# Patient Record
Sex: Male | Born: 1960 | Race: Black or African American | Hispanic: No | Marital: Married | Smoking: Former smoker
Health system: Southern US, Community
[De-identification: ages and names within clinical notes are randomized; demographics above are authoritative.]

## PROBLEM LIST (undated history)

## (undated) DIAGNOSIS — I10 Essential (primary) hypertension: Secondary | ICD-10-CM

## (undated) HISTORY — PX: COLONOSCOPY: SHX174

---

## 2000-08-06 ENCOUNTER — Emergency Department (HOSPITAL_COMMUNITY): Admission: EM | Admit: 2000-08-06 | Discharge: 2000-08-06 | Payer: Self-pay | Admitting: Emergency Medicine

## 2000-10-08 ENCOUNTER — Emergency Department (HOSPITAL_COMMUNITY): Admission: EM | Admit: 2000-10-08 | Discharge: 2000-10-08 | Payer: Self-pay | Admitting: Internal Medicine

## 2000-10-08 ENCOUNTER — Encounter: Payer: Self-pay | Admitting: Internal Medicine

## 2002-11-16 ENCOUNTER — Emergency Department (HOSPITAL_COMMUNITY): Admission: EM | Admit: 2002-11-16 | Discharge: 2002-11-16 | Payer: Self-pay | Admitting: Emergency Medicine

## 2003-02-16 ENCOUNTER — Emergency Department (HOSPITAL_COMMUNITY): Admission: EM | Admit: 2003-02-16 | Discharge: 2003-02-16 | Payer: Self-pay

## 2004-09-05 ENCOUNTER — Emergency Department (HOSPITAL_COMMUNITY): Admission: EM | Admit: 2004-09-05 | Discharge: 2004-09-05 | Payer: Self-pay | Admitting: Emergency Medicine

## 2004-09-15 ENCOUNTER — Emergency Department (HOSPITAL_COMMUNITY): Admission: EM | Admit: 2004-09-15 | Discharge: 2004-09-15 | Payer: Self-pay | Admitting: Family Medicine

## 2008-03-27 ENCOUNTER — Encounter: Admission: RE | Admit: 2008-03-27 | Discharge: 2008-03-27 | Payer: Self-pay | Admitting: Occupational Medicine

## 2008-05-25 ENCOUNTER — Emergency Department (HOSPITAL_COMMUNITY): Admission: EM | Admit: 2008-05-25 | Discharge: 2008-05-25 | Payer: Self-pay | Admitting: Emergency Medicine

## 2009-06-10 ENCOUNTER — Encounter (INDEPENDENT_AMBULATORY_CARE_PROVIDER_SITE_OTHER): Payer: Self-pay | Admitting: *Deleted

## 2009-12-19 ENCOUNTER — Ambulatory Visit (HOSPITAL_COMMUNITY): Admission: RE | Admit: 2009-12-19 | Discharge: 2009-12-19 | Payer: Self-pay | Admitting: Gastroenterology

## 2010-05-13 NOTE — Letter (Signed)
Summary: Previsit letter  [facility]  [address] [place], [place] [address]   Phone: [phone]  Fax: [phone]       06/10/2009 MRN: [id number]  [name] [address] [place], [place]  [address]  Dear [name],  Welcome to the Gastroenterology Division at [facility].    Timothy are scheduled to see a nurse for your pre-procedure visit on 06-25-09 at 2:30p.m. on the 3rd floor at [facility], [address]  We ask that Timothy try to arrive at our office 15 minutes prior to your appointment time to allow for check-in.  Your nurse visit will consist of discussing your medical and surgical history, your immediate family medical history, and your medications.    Please bring a complete list of all your medications or, if Timothy prefer, bring the medication bottles and we will list them.  We will need to be aware of both prescribed and over the counter drugs.  We will need to know exact dosage information as well.  If Timothy are on blood thinners (Coumadin, Plavix, Aggrenox, Ticlid, etc.) please call our office today/prior to your appointment, as we need to consult with your physician about holding your medication.   Please be prepared to read and sign documents such as consent forms, a financial agreement, and acknowledgement forms.  If necessary, and with your consent, a friend or relative is welcome to sit-in on the nurse visit with Timothy.  Please bring your insurance card so that we may make a copy of it.  If your insurance requires a referral to see a specialist, please bring your referral form from your primary care physician.  No co-pay is required for this nurse visit.     If Timothy cannot keep your appointment, please call [phone] to cancel or reschedule prior to your appointment date.  This allows [place] the opportunity to schedule an appointment for another patient in need of care.    Thank Timothy for choosing [facility] for your medical needs.  We  appreciate the opportunity to care for Timothy.  Please visit [place] at our website  to learn more about our practice.                     Sincerely.                                                                                                                   The Gastroenterology Division

## 2010-06-26 LAB — GLUCOSE, CAPILLARY: Glucose-Capillary: 134 mg/dL — ABNORMAL HIGH (ref 70–99)

## 2010-07-29 LAB — GLUCOSE, CAPILLARY: Glucose-Capillary: 225 mg/dL — ABNORMAL HIGH (ref 70–99)

## 2010-11-10 ENCOUNTER — Encounter: Payer: Self-pay | Admitting: *Deleted

## 2010-11-10 DIAGNOSIS — R55 Syncope and collapse: Secondary | ICD-10-CM | POA: Insufficient documentation

## 2010-11-10 DIAGNOSIS — R32 Unspecified urinary incontinence: Secondary | ICD-10-CM | POA: Insufficient documentation

## 2010-11-10 DIAGNOSIS — E119 Type 2 diabetes mellitus without complications: Secondary | ICD-10-CM | POA: Insufficient documentation

## 2010-11-10 DIAGNOSIS — F17200 Nicotine dependence, unspecified, uncomplicated: Secondary | ICD-10-CM | POA: Insufficient documentation

## 2010-11-10 DIAGNOSIS — R0602 Shortness of breath: Secondary | ICD-10-CM | POA: Insufficient documentation

## 2010-11-10 DIAGNOSIS — R29898 Other symptoms and signs involving the musculoskeletal system: Secondary | ICD-10-CM | POA: Insufficient documentation

## 2010-11-10 NOTE — ED Notes (Signed)
Pt reports increasing sob x 2 days, pt denies any cough

## 2010-11-11 ENCOUNTER — Emergency Department (HOSPITAL_COMMUNITY): Payer: Self-pay

## 2010-11-11 ENCOUNTER — Emergency Department (HOSPITAL_COMMUNITY)
Admission: EM | Admit: 2010-11-11 | Discharge: 2010-11-11 | Disposition: A | Discharge status: Home / Self Care | Payer: Self-pay | Attending: Emergency Medicine | Admitting: Emergency Medicine

## 2010-11-11 HISTORY — DX: Essential (primary) hypertension: I10

## 2010-11-11 LAB — DIFFERENTIAL
Basophils Absolute: 0 10*3/uL (ref 0.0–0.1)
Basophils Relative: 0 % (ref 0–1)
Lymphocytes Relative: 49 % — ABNORMAL HIGH (ref 12–46)
Monocytes Absolute: 0.6 10*3/uL (ref 0.1–1.0)
Neutro Abs: 2.9 10*3/uL (ref 1.7–7.7)
Neutrophils Relative %: 41 % — ABNORMAL LOW (ref 43–77)

## 2010-11-11 LAB — CBC
HCT: 41.7 % (ref 39.0–52.0)
MCHC: 34.8 g/dL (ref 30.0–36.0)
Platelets: 224 10*3/uL (ref 150–400)
RDW: 12.7 % (ref 11.5–15.5)
WBC: 7.2 10*3/uL (ref 4.0–10.5)

## 2010-11-11 LAB — COMPREHENSIVE METABOLIC PANEL
ALT: 44 U/L (ref 0–53)
AST: 22 U/L (ref 0–37)
Albumin: 3.7 g/dL (ref 3.5–5.2)
Chloride: 97 mEq/L (ref 96–112)
Creatinine, Ser: 1.03 mg/dL (ref 0.50–1.35)
Sodium: 131 mEq/L — ABNORMAL LOW (ref 135–145)
Total Bilirubin: 0.5 mg/dL (ref 0.3–1.2)

## 2010-11-11 NOTE — ED Notes (Signed)
Pt told md that he had not taken insulin in 2 days, that he could not afford it.  He is to call social worker in morning to get help with meds.

## 2010-11-11 NOTE — ED Provider Notes (Signed)
History     Chief Complaint  Patient presents with  . Shortness of Breath   Patient is a 50 y.o. male presenting with shortness of breath, extremity weakness, and syncope.  Shortness of Breath  Associated symptoms include shortness of breath. Pertinent negatives include no chest pain and no cough.  Extremity Weakness Associated symptoms include shortness of breath. Pertinent negatives include no chest pain, no abdominal pain and no headaches. He has tried nothing for the symptoms.  Loss of Consciousness This is a new (Patient complains of some shortness of breath with some weakness mild dizziness) problem. The current episode started 3 to 5 hours ago. The problem occurs hourly. Associated symptoms include shortness of breath. Pertinent negatives include no chest pain, no abdominal pain and no headaches. The symptoms are aggravated by bending. The symptoms are relieved by ice.    Past Medical History  Diagnosis Date  . Diabetes mellitus   . Hypertension     History reviewed. No pertinent past surgical history.  No family history on file.  History  Substance Use Topics  . Smoking status: Current Everyday Smoker    Types: Cigars  . Smokeless tobacco: Not on file  . Alcohol Use: No      Review of Systems  Constitutional: Positive for fatigue. Negative for chills and appetite change.  HENT: Negative for congestion, neck pain, sinus pressure and ear discharge.   Eyes: Positive for redness. Negative for discharge.  Respiratory: Positive for shortness of breath. Negative for cough.   Cardiovascular: Positive for syncope. Negative for chest pain.  Gastrointestinal: Negative for abdominal pain and diarrhea.  Genitourinary: Positive for enuresis. Negative for frequency and hematuria.  Musculoskeletal: Positive for extremity weakness. Negative for back pain.  Skin: Negative for rash.  Neurological: Negative for seizures and headaches.  Hematological: Negative.     Psychiatric/Behavioral: Negative for hallucinations.    Physical Exam  BP 111/77  Pulse 58  Temp(Src) 98.5 F (36.9 C) (Oral)  Resp 16  Ht 5\' 6"  (1.676 m)  Wt 218 lb (98.884 kg)  BMI 35.19 kg/m2  SpO2 96%  Physical Exam  Constitutional: He is oriented to person, place, and time. He appears well-developed.  HENT:  Head: Normocephalic and atraumatic.  Eyes: Conjunctivae and EOM are normal. No scleral icterus.  Neck: Neck supple. No thyromegaly present.  Cardiovascular: Normal rate and regular rhythm.  Exam reveals no gallop and no friction rub.   No murmur heard. Pulmonary/Chest: No stridor. He has no wheezes. He has no rales. He exhibits no tenderness.  Abdominal: He exhibits no distension. There is no tenderness. There is no rebound.  Musculoskeletal: Normal range of motion. He exhibits no edema.  Lymphadenopathy:    He has no cervical adenopathy.  Neurological: He is oriented to person, place, and time. Coordination normal.  Skin: No rash noted. No erythema.  Psychiatric: He has a normal mood and affect. His behavior is normal.    ED Course  Procedures  MDM Diabetes poorly contoled      Benny Lennert, MD 11/11/10 [phone]

## 2010-11-11 NOTE — ED Notes (Signed)
Says balance felt "off". And sob  For 2 days.  No cough. Alert, NAD.

## 2010-11-11 NOTE — ED Notes (Signed)
MD at bedside. 

## 2011-08-12 DIAGNOSIS — Z0271 Encounter for disability determination: Secondary | ICD-10-CM

## 2011-09-21 ENCOUNTER — Other Ambulatory Visit: Payer: Self-pay | Admitting: Physician Assistant

## 2011-10-06 ENCOUNTER — Encounter: Payer: Self-pay | Admitting: *Deleted

## 2011-10-06 ENCOUNTER — Other Ambulatory Visit: Payer: Self-pay

## 2011-10-06 NOTE — Telephone Encounter (Signed)
PT STATES THAT HE IS CURRENTLY OUT OF INSULIN, PT STATES THAT HE DOES NOT USE A PHARMACY BUT HE DOES GO THROUGH A PROGRAM [name] WEBER REFERRED HIM TO FOR HIS DIABETES. PT STATES THROUGH THIS PROGRAM HE RECEIVES HIS INSULIN FOR FREE PT IS UNSURE OF THE NAME OF THE PROGRAM.

## 2011-10-07 ENCOUNTER — Encounter: Payer: Self-pay | Admitting: Family Medicine

## 2011-10-07 ENCOUNTER — Ambulatory Visit: Payer: Self-pay | Admitting: Family Medicine

## 2011-10-07 VITALS — BP 113/71 | HR 56 | Temp 98.4°F | Resp 16 | Ht 68.0 in | Wt 195.0 lb

## 2011-10-07 DIAGNOSIS — I10 Essential (primary) hypertension: Secondary | ICD-10-CM

## 2011-10-07 DIAGNOSIS — E785 Hyperlipidemia, unspecified: Secondary | ICD-10-CM

## 2011-10-07 DIAGNOSIS — E109 Type 1 diabetes mellitus without complications: Secondary | ICD-10-CM

## 2011-10-07 DIAGNOSIS — M25569 Pain in unspecified knee: Secondary | ICD-10-CM

## 2011-10-07 LAB — POCT GLYCOSYLATED HEMOGLOBIN (HGB A1C): Hemoglobin A1C: 7.4

## 2011-10-07 MED ORDER — PRAVASTATIN SODIUM 40 MG PO TABS: 40.0000 mg | ORAL_TABLET | Freq: Every day | ORAL | Status: DC

## 2011-10-07 MED ORDER — LISINOPRIL 10 MG PO TABS: 10.0000 mg | ORAL_TABLET | Freq: Every day | ORAL | Status: DC

## 2011-10-07 NOTE — Progress Notes (Signed)
@[email or web address] @  Patient ID: Timothy Costa MRN: [id number], DOB: 1961-02-23, 51 y.o. Date of Encounter: 10/07/2011, 4:12 PM  Primary Physician: No primary provider on file.  Chief Complaint: Diabetes follow up  (onset 2006)  HPI: 51 y.o. year old male with history below presents for follow up of diabetes mellitus. Doing well. No issues or complaints. Taking medications daily without adverse effects. No polydipsia, polyphagia, polyuria, or nocturia.  Blood sugars at home: around 200 Diet consists of:  "cheated" with cakes last Sunday, occasional soda; eating fried foods Exercising regularly.  Ran out of Lantus 3 weeks ago.   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  The injections of steroid did not help.  Past Medical History  Diagnosis Date  . Diabetes mellitus   . Hypertension      Home Meds: Prior to Admission medications   Medication Sig Start Date End Date Taking? Authorizing Provider  insulin glargine (LANTUS) 100 UNIT/ML injection Inject 30 Units into the skin daily before breakfast.      Historical Provider, MD  lisinopril (PRINIVIL,ZESTRIL) 10 MG tablet Take 1 tablet (10 mg total) by mouth daily. NEEDS OFFICE VISIT/LABS FOR MORE 09/21/11   Pattricia Boss, PA-C  LISINOPRIL PO Take 1 tablet by mouth daily. Patient is unsure of strength.    Historical Provider, MD    Allergies:  Allergies  Allergen Reactions  . Nsaids Other (See Comments)    Gi upset    History   Social History  . Marital Status: Single    Spouse Name: N/A    Number of Children: N/A  . Years of Education: N/A   Occupational History  . Not on file.   Social History Main Topics  . Smoking status: Current Everyday Smoker    Types: Cigars  . Smokeless tobacco: Not on file  . Alcohol Use: No  . Drug Use:  No  . Sexually Active:    Other Topics Concern  . Not on file   Social History Narrative  . No narrative on file     Review of Systems: Constitutional: negative for chills, fever, night sweats, weight changes, or fatigue  HEENT: negative for vision changes, hearing loss, congestion, rhinorrhea, or epistaxis Cardiovascular: negative for chest pain, palpitations, diaphoresis, DOE, orthopnea, or edema Respiratory: negative for hemoptysis, wheezing, shortness of breath, dyspnea, or cough Abdominal: negative for abdominal pain, nausea, vomiting, diarrhea, or constipation Dermatological: negative for rash, erythema, or wounds Neurologic: negative for headache, dizziness, or syncope Renal:  Negative for polyuria, polydipsia, or dysuria All other systems reviewed and are otherwise negative with the exception to those above and in the HPI.   Physical Exam: Blood pressure 113/71, pulse 56, temperature 98.4 F (36.9 C), temperature source Oral, resp. rate 16, height 5\' 8"  (1.727 m), weight 195 lb (88.451 kg), SpO2 98.00%., Body mass index is 29.65 kg/(m^2). General: Well developed, well nourished, in no acute distress. Eyes:  Normal fundi Head: Normocephalic, atraumatic, eyes without discharge, sclera non-icteric, nares are without discharge. Bilateral auditory canals clear, TM's are without perforation, pearly grey and translucent with reflective cone of light bilaterally. Oral cavity moist, posterior pharynx without exudate, erythema, peritonsillar abscess, or post nasal drip.  Neck: Supple. No thyromegaly. Full ROM. No lymphadenopathy. Lungs: Clear bilaterally to auscultation without wheezes, rales, or  rhonchi. Breathing is unlabored. Heart: RRR with S1 S2. No murmurs, rubs, or gallops appreciated. Abdomen: Soft, non-tender, non-distended with normoactive bowel sounds. No hepatosplenomegaly. No rebound/guarding. No obvious abdominal masses. Msk:  Strength and tone normal for  age. Extremities/Skin: Warm and dry. No clubbing or cyanosis. No edema. No rashes, wounds, or suspicious lesions. Monofilament exam unremarkable bilaterally.  Neuro: Alert and oriented X 3. Moves all extremities spontaneously. Gait is normal. CNII-XII grossly in tact. Psych:  Responds to questions appropriately with a normal affect.   Labs:   ASSESSMENT AND PLAN:  51 y.o. year old male with IDDM, hyperlipidemia -  Signed, Elvina Sidle, MD 10/07/2011 4:12 PM

## 2011-10-07 NOTE — Telephone Encounter (Signed)
Advised pt of company and that he needs to come in.

## 2011-10-24 ENCOUNTER — Telehealth: Payer: Self-pay

## 2011-10-24 NOTE — Telephone Encounter (Signed)
Dr L do you know what pt is referring to? Please advise

## 2011-10-24 NOTE — Telephone Encounter (Signed)
Pt says there was supposed to be an order put in for him?? Wants to know if that has been done yet .  [phone].

## 2011-10-26 ENCOUNTER — Telehealth: Payer: Self-pay

## 2011-10-26 NOTE — Telephone Encounter (Signed)
The patient's wife called to see why she had not been given a return call regarding her husband's need for insulin Rx.  No call related to insulin noted in system.  She stated that Dr. Milus Glazier was supposed to order the insulin so that they could receive the insulin for free.  The patient's wife is very upset and stated that her husband had not had any insulin in the last few days.  Please call the patient's wife at [phone].

## 2011-10-27 NOTE — Telephone Encounter (Signed)
Insulin was received and pt notified for pickup

## 2011-11-03 NOTE — Telephone Encounter (Signed)
I don't know what patient is referring to.  I tried to call but his inbox was full and no one answers.

## 2011-12-07 ENCOUNTER — Telehealth: Payer: Self-pay

## 2011-12-07 NOTE — Telephone Encounter (Signed)
Notified pt that [employer] form (for pt's Lantus) is ready to p/up.

## 2011-12-30 ENCOUNTER — Emergency Department (HOSPITAL_COMMUNITY)
Admission: EM | Admit: 2011-12-30 | Discharge: 2011-12-30 | Disposition: A | Discharge status: Home / Self Care | Payer: Self-pay | Attending: Emergency Medicine | Admitting: Emergency Medicine

## 2011-12-30 ENCOUNTER — Encounter (HOSPITAL_COMMUNITY): Payer: Self-pay | Admitting: Emergency Medicine

## 2011-12-30 DIAGNOSIS — E119 Type 2 diabetes mellitus without complications: Secondary | ICD-10-CM | POA: Insufficient documentation

## 2011-12-30 DIAGNOSIS — M25562 Pain in left knee: Secondary | ICD-10-CM

## 2011-12-30 DIAGNOSIS — M25561 Pain in right knee: Secondary | ICD-10-CM

## 2011-12-30 DIAGNOSIS — Z888 Allergy status to other drugs, medicaments and biological substances status: Secondary | ICD-10-CM | POA: Insufficient documentation

## 2011-12-30 DIAGNOSIS — I10 Essential (primary) hypertension: Secondary | ICD-10-CM | POA: Insufficient documentation

## 2011-12-30 DIAGNOSIS — M25569 Pain in unspecified knee: Secondary | ICD-10-CM | POA: Insufficient documentation

## 2011-12-30 DIAGNOSIS — F17200 Nicotine dependence, unspecified, uncomplicated: Secondary | ICD-10-CM | POA: Insufficient documentation

## 2011-12-30 DIAGNOSIS — Z8673 Personal history of transient ischemic attack (TIA), and cerebral infarction without residual deficits: Secondary | ICD-10-CM | POA: Insufficient documentation

## 2011-12-30 MED ORDER — DEXAMETHASONE 4 MG PO TABS: ORAL_TABLET | ORAL | Status: DC

## 2011-12-30 MED ORDER — HYDROCODONE-ACETAMINOPHEN 7.5-325 MG PO TABS: ORAL_TABLET | ORAL | Status: DC

## 2011-12-30 MED ORDER — DEXAMETHASONE SODIUM PHOSPHATE 4 MG/ML IJ SOLN
8.0000 mg | Freq: Once | INTRAMUSCULAR | Status: AC
  Administered 2011-12-30: 8 mg via INTRAMUSCULAR
  Filled 2011-12-30: qty 2

## 2011-12-30 MED ORDER — HYDROCODONE-ACETAMINOPHEN 5-325 MG PO TABS
2.0000 | ORAL_TABLET | Freq: Once | ORAL | Status: AC
  Administered 2011-12-30: 2 via ORAL
  Filled 2011-12-30: qty 2

## 2011-12-30 MED ORDER — ONDANSETRON HCL 4 MG PO TABS
4.0000 mg | ORAL_TABLET | Freq: Once | ORAL | Status: AC
  Administered 2011-12-30: 4 mg via ORAL
  Filled 2011-12-30: qty 1

## 2011-12-30 NOTE — ED Provider Notes (Signed)
Medical screening examination/treatment/procedure(s) were performed by non-physician practitioner and as supervising physician I was immediately available for consultation/collaboration.   Sande Pickert L Kevan Prouty, MD 12/30/11 1538 

## 2011-12-30 NOTE — ED Notes (Signed)
Patient with c/o bilateral knee pain "for a while". No injury.

## 2011-12-30 NOTE — ED Provider Notes (Signed)
History     CSN: [id number]  Arrival date & time 12/30/11  0865   First MD Initiated Contact with Patient 12/30/11 [phone]      Chief Complaint  Patient presents with  . Knee Pain    (Consider location/radiation/quality/duration/timing/severity/associated sxs/prior treatment) Patient is a 51 y.o. male presenting with knee pain. The history is provided by the patient.  Knee Pain This is a chronic problem. The current episode started more than 1 month ago. The problem has been gradually worsening (Pt has been diagnosed with djd of the knees.). Associated symptoms include arthralgias. Pertinent negatives include no abdominal pain, chest pain, coughing or neck pain. The symptoms are aggravated by walking and bending. He has tried acetaminophen for the symptoms. The treatment provided no relief.    Past Medical History  Diagnosis Date  . Diabetes mellitus   . Hypertension     History reviewed. No pertinent past surgical history.  Family History  Problem Relation Age of Onset  . Diabetes Mother   . Stroke Father   . Hypertension Father   . Diabetes Sister   . Diabetes Son     History  Substance Use Topics  . Smoking status: Current Every Day Smoker    Types: Cigars  . Smokeless tobacco: Not on file  . Alcohol Use: No      Review of Systems  Constitutional: Negative for activity change.       All ROS Neg except as noted in HPI  HENT: Negative for nosebleeds and neck pain.   Eyes: Negative for photophobia and discharge.  Respiratory: Negative for cough, shortness of breath and wheezing.   Cardiovascular: Negative for chest pain and palpitations.  Gastrointestinal: Negative for abdominal pain and blood in stool.  Genitourinary: Negative for dysuria, frequency and hematuria.  Musculoskeletal: Positive for arthralgias. Negative for back pain.  Skin: Negative.   Neurological: Negative for dizziness, seizures and speech difficulty.  Psychiatric/Behavioral: Negative for  hallucinations and confusion.    Allergies  Nsaids  Home Medications   Current Outpatient Rx  Name Route Sig Dispense Refill  . ACETAMINOPHEN 500 MG PO TABS Oral Take 1,500 mg by mouth every 6 (six) hours as needed. Pain    . INSULIN GLARGINE 100 UNIT/ML  SOLN Subcutaneous Inject 30 Units into the skin daily before breakfast.      . LISINOPRIL 10 MG PO TABS Oral Take 10 mg by mouth daily.    Marland Kitchen PRAVASTATIN SODIUM 40 MG PO TABS Oral Take 1 tablet (40 mg total) by mouth daily. 30 tablet 11    BP 118/83  Pulse 51  Temp 98.3 F (36.8 C) (Oral)  Resp 17  Ht 5\' 8"  (1.727 m)  Wt 190 lb (86.183 kg)  BMI 28.89 kg/m2  SpO2 98%  Physical Exam  Nursing note and vitals reviewed. Constitutional: He is oriented to person, place, and time. He appears well-developed and well-nourished.  Non-toxic appearance.  HENT:  Head: Normocephalic.  Right Ear: Tympanic membrane and external ear normal.  Left Ear: Tympanic membrane and external ear normal.  Eyes: EOM and lids are normal. Pupils are equal, round, and reactive to light.  Neck: Normal range of motion. Neck supple. Carotid bruit is not present.  Cardiovascular: Normal rate, regular rhythm, normal heart sounds, intact distal pulses and normal pulses.   Pulmonary/Chest: Breath sounds normal. No respiratory distress.  Abdominal: Soft. Bowel sounds are normal. There is no tenderness. There is no guarding.  Musculoskeletal: Normal range of motion.  There is full range of motion of right and left hips. There is crepitus noted bilaterally of the knees. There is some degenerative deformity of the knees. The knees are not hot. There is no effusion present. Full range of motion of right and left ankles. Distal pulses are symmetrical.  Lymphadenopathy:       Head (right side): No submandibular adenopathy present.       Head (left side): No submandibular adenopathy present.    He has no cervical adenopathy.  Neurological: He is alert and  oriented to person, place, and time. He has normal strength. No cranial nerve deficit or sensory deficit.  Skin: Skin is warm and dry.  Psychiatric: He has a normal mood and affect. His speech is normal.    ED Course  Procedures (including critical care time)  Labs Reviewed - No data to display No results found.   No diagnosis found.    MDM  I have reviewed nursing notes, vital signs, and all appropriate lab and imaging results for this patient. Patient presents to the emergency department with bilateral knee pain. The patient has been having problems in this area for quite some time. He has been diagnosed by an orthopedic specialist and told that he was" wearing out the cartilage". The patient no longer has insurance and has not been able to followup with the specialist. He has tried exercises, Valterin GEL, and Tylenol. The patient states that particularly at night he has severe pain in his knees and presents to the emergency department for additional evaluation of this problem. Examination today does not reveal evidence of a hot joint, effusion, or any dislocation or fracture. The plan at this time is for the patient to continue his Voltaren. To use a short course of steroid, and to use Norco at bedtime to help with his discomfort. The patient is encouraged to see an orthopedic specialist as sone as he is able.       Kathie Dike, [place] 12/30/11 1010

## 2012-04-19 ENCOUNTER — Telehealth: Payer: Self-pay

## 2012-04-19 NOTE — Telephone Encounter (Signed)
Patient wants Benny Lennert to know that he is almost out of his insulin. States she has him with a program that he gets his medication through. Please call back at [phone].

## 2012-04-20 NOTE — Telephone Encounter (Signed)
Unable to reach patient at number given, need to clarify dose. Hopefully, he will call back.

## 2012-04-20 NOTE — Telephone Encounter (Signed)
I need to know if he is still on Lantus 30mg  qd and do I need to just write him a Rx.  I am more than happy to do this but he is also due for an OV.

## 2012-04-21 NOTE — Telephone Encounter (Signed)
Advised pt that he is due for ov. Pt states that he is still working on getting some ins.  Advised him that he really needs to get blood work done really soon.  Paperwork for insulin sent off.

## 2012-06-13 ENCOUNTER — Telehealth: Payer: Self-pay

## 2012-06-13 NOTE — Telephone Encounter (Signed)
Pt is waiting a call back when his insulin has arrived.  He states it is past due and he is out.   CBN:  [phone]

## 2012-06-15 NOTE — Telephone Encounter (Signed)
Patient called again asking about insulin. I checked previous message from January which states he needs an OV. I relayed info to patient and he says he has no insurance and is on a program to get his insulin and he is out. Would like [name] to call him to discuss refill.

## 2012-06-16 MED ORDER — INSULIN GLARGINE 100 UNIT/ML ~~LOC~~ SOLN: 30.0000 [IU] | Freq: Every day | SUBCUTANEOUS | Status: DC

## 2012-06-16 NOTE — Telephone Encounter (Signed)
Patient wants renewal on his Lantus. Please advise.

## 2012-06-16 NOTE — Telephone Encounter (Signed)
Spoke with pt, advised RX sent to pharmacy. He will try to come in as soon as possible, he has no insurance.

## 2012-06-16 NOTE — Telephone Encounter (Signed)
Lantus sent.  Needs OV, he has not been seen since June 2013.  He needs labs.

## 2012-06-17 ENCOUNTER — Emergency Department (HOSPITAL_COMMUNITY)
Admission: EM | Admit: 2012-06-17 | Discharge: 2012-06-17 | Disposition: A | Discharge status: Home / Self Care | Payer: Self-pay | Attending: Emergency Medicine | Admitting: Emergency Medicine

## 2012-06-17 ENCOUNTER — Encounter (HOSPITAL_COMMUNITY): Payer: Self-pay | Admitting: Emergency Medicine

## 2012-06-17 DIAGNOSIS — I10 Essential (primary) hypertension: Secondary | ICD-10-CM | POA: Insufficient documentation

## 2012-06-17 DIAGNOSIS — R197 Diarrhea, unspecified: Secondary | ICD-10-CM | POA: Insufficient documentation

## 2012-06-17 DIAGNOSIS — Z794 Long term (current) use of insulin: Secondary | ICD-10-CM | POA: Insufficient documentation

## 2012-06-17 DIAGNOSIS — F17200 Nicotine dependence, unspecified, uncomplicated: Secondary | ICD-10-CM | POA: Insufficient documentation

## 2012-06-17 DIAGNOSIS — IMO0002 Reserved for concepts with insufficient information to code with codable children: Secondary | ICD-10-CM | POA: Insufficient documentation

## 2012-06-17 DIAGNOSIS — R1110 Vomiting, unspecified: Secondary | ICD-10-CM

## 2012-06-17 DIAGNOSIS — R509 Fever, unspecified: Secondary | ICD-10-CM | POA: Insufficient documentation

## 2012-06-17 DIAGNOSIS — E1169 Type 2 diabetes mellitus with other specified complication: Secondary | ICD-10-CM | POA: Insufficient documentation

## 2012-06-17 DIAGNOSIS — R112 Nausea with vomiting, unspecified: Secondary | ICD-10-CM | POA: Insufficient documentation

## 2012-06-17 DIAGNOSIS — Z79899 Other long term (current) drug therapy: Secondary | ICD-10-CM | POA: Insufficient documentation

## 2012-06-17 DIAGNOSIS — R739 Hyperglycemia, unspecified: Secondary | ICD-10-CM

## 2012-06-17 LAB — POCT I-STAT, CHEM 8
BUN: 19 mg/dL (ref 6–23)
Creatinine, Ser: 1.3 mg/dL (ref 0.50–1.35)
Glucose, Bld: 219 mg/dL — ABNORMAL HIGH (ref 70–99)
Potassium: 3.9 mEq/L (ref 3.5–5.1)
Sodium: 139 mEq/L (ref 135–145)
TCO2: 27 mmol/L (ref 0–100)

## 2012-06-17 MED ORDER — ONDANSETRON 8 MG PO TBDP
8.0000 mg | ORAL_TABLET | Freq: Once | ORAL | Status: AC
  Administered 2012-06-17: 8 mg via ORAL
  Filled 2012-06-17: qty 1

## 2012-06-17 MED ORDER — METFORMIN HCL 500 MG PO TABS: 500.0000 mg | ORAL_TABLET | Freq: Two times a day (BID) | ORAL | Status: DC

## 2012-06-17 NOTE — ED Notes (Signed)
No vomiting noted since medication administration. Gave patient ginger ale as requested and ordered. Patient sitting in bed sipping ginger ale at this time.

## 2012-06-17 NOTE — ED Notes (Signed)
Patient complaining of emesis, chills, and diarrhea that started this morning after eating breakfast.

## 2012-06-17 NOTE — ED Notes (Signed)
Patient has had no vomiting since arrival to ED. Denies pain at discharge. Drank ginger ale and was able to keep liquid down.

## 2012-06-17 NOTE — ED Provider Notes (Signed)
History     CSN: [id number]  Arrival date & time 06/17/12  1845   First MD Initiated Contact with Patient 06/17/12 1919      Chief Complaint  Patient presents with  . Emesis  . Diarrhea     Patient is a 52 y.o. male presenting with vomiting. The history is provided by the patient.  Emesis Severity:  Moderate Timing:  Intermittent Able to tolerate:  Liquids Progression:  Unchanged Chronicity:  New Relieved by:  Nothing Worsened by:  Nothing tried Associated symptoms: chills and fever   Associated symptoms: no abdominal pain and no cough   Risk factors: no alcohol use   Pt reports nausea/vomiting and diarrhea since this morning He reports about 3 episodes of vomit and diarrhea.  No blood in stool or vomit No significant abdominal pain He does report fever and chills No sob or cough No recent travel.  No sick contacts.  Past Medical History  Diagnosis Date  . Diabetes mellitus   . Hypertension     History reviewed. No pertinent past surgical history.  Family History  Problem Relation Age of Onset  . Diabetes Mother   . Stroke Father   . Hypertension Father   . Diabetes Sister   . Diabetes Son     History  Substance Use Topics  . Smoking status: Current Every Day Smoker    Types: Cigars  . Smokeless tobacco: Not on file  . Alcohol Use: No      Review of Systems  Constitutional: Positive for chills.  Gastrointestinal: Positive for vomiting. Negative for abdominal pain.  All other systems reviewed and are negative.    Allergies  Nsaids  Home Medications   Current Outpatient Rx  Name  Route  Sig  Dispense  Refill  . acetaminophen (TYLENOL) 500 MG tablet   Oral   Take 1,500 mg by mouth every 6 (six) hours as needed. Pain         . dexamethasone (DECADRON) 4 MG tablet      1 po daily after a meal   6 tablet   0   . HYDROcodone-acetaminophen (NORCO) 7.5-325 MG per tablet      1 at hs for discomfort   15 tablet   0   . insulin glargine  (LANTUS) 100 UNIT/ML injection   Subcutaneous   Inject 30 Units into the skin daily before breakfast.   10 mL   0   . lisinopril (PRINIVIL,ZESTRIL) 10 MG tablet   Oral   Take 10 mg by mouth daily.         . pravastatin (PRAVACHOL) 40 MG tablet   Oral   Take 1 tablet (40 mg total) by mouth daily.   30 tablet   11     BP 122/68  Pulse 120  Temp(Src) 99 F (37.2 C) (Oral)  Resp 18  Ht 5\' 1"  (1.549 m)  Wt 180 lb (81.647 kg)  BMI 34.03 kg/m2  SpO2 100%  Physical Exam CONSTITUTIONAL: Well developed/well nourished HEAD: Normocephalic/atraumatic EYES: EOMI/PERRL, no icterus ENMT: Mucous membranes moist NECK: supple no meningeal signs SPINE:entire spine nontender CV: S1/S2 noted, no murmurs/rubs/gallops noted LUNGS: Lungs are clear to auscultation bilaterally, no apparent distress ABDOMEN: soft, nontender, no rebound or guarding NEURO: Pt is awake/alert, moves all extremitiesx4 EXTREMITIES: pulses normal, full ROM SKIN: warm, color normal PSYCH: no abnormalities of mood noted  ED Course  Procedures  7:53 PM Pt is very well appearing.  He is in no distress  and abdominal exam is benign.  Will try PO challenge and check electrolytes given he is diabetic and admits he has been using insulin lately.  Labs reassurring except for mild hyperglycemia.  Pt reports no insulin for weeks and he can't afford it.  He reports he has used oral diabetic meds previously.  Will start metformin for him.  Advised not to take until his GI illness has resolved.  He is taking PO and well appearing.  MDM  Nursing notes including past medical history and social history reviewed and considered in documentation Labs/vital reviewed and considered         Joya Gaskins, MD 06/17/12 2051

## 2012-07-29 ENCOUNTER — Emergency Department (HOSPITAL_COMMUNITY)
Admission: EM | Admit: 2012-07-29 | Discharge: 2012-07-29 | Disposition: A | Discharge status: Home / Self Care | Payer: Self-pay | Attending: Emergency Medicine | Admitting: Emergency Medicine

## 2012-07-29 ENCOUNTER — Encounter (HOSPITAL_COMMUNITY): Payer: Self-pay | Admitting: *Deleted

## 2012-07-29 ENCOUNTER — Emergency Department (HOSPITAL_COMMUNITY): Payer: Self-pay

## 2012-07-29 DIAGNOSIS — Z79899 Other long term (current) drug therapy: Secondary | ICD-10-CM | POA: Insufficient documentation

## 2012-07-29 DIAGNOSIS — R05 Cough: Secondary | ICD-10-CM | POA: Insufficient documentation

## 2012-07-29 DIAGNOSIS — R059 Cough, unspecified: Secondary | ICD-10-CM | POA: Insufficient documentation

## 2012-07-29 DIAGNOSIS — R042 Hemoptysis: Secondary | ICD-10-CM | POA: Insufficient documentation

## 2012-07-29 DIAGNOSIS — F17200 Nicotine dependence, unspecified, uncomplicated: Secondary | ICD-10-CM | POA: Insufficient documentation

## 2012-07-29 DIAGNOSIS — I10 Essential (primary) hypertension: Secondary | ICD-10-CM | POA: Insufficient documentation

## 2012-07-29 DIAGNOSIS — E119 Type 2 diabetes mellitus without complications: Secondary | ICD-10-CM | POA: Insufficient documentation

## 2012-07-29 DIAGNOSIS — Z794 Long term (current) use of insulin: Secondary | ICD-10-CM | POA: Insufficient documentation

## 2012-07-29 LAB — BASIC METABOLIC PANEL
BUN: 19 mg/dL (ref 6–23)
Calcium: 9.6 mg/dL (ref 8.4–10.5)
Creatinine, Ser: 1.58 mg/dL — ABNORMAL HIGH (ref 0.50–1.35)
GFR calc non Af Amer: 49 mL/min — ABNORMAL LOW (ref >90)
Glucose, Bld: 543 mg/dL — ABNORMAL HIGH (ref 70–99)

## 2012-07-29 LAB — CBC WITH DIFFERENTIAL/PLATELET
Eosinophils Absolute: 0.1 10*3/uL (ref 0.0–0.7)
Eosinophils Relative: 1 % (ref 0–5)
Hemoglobin: 14.6 g/dL (ref 13.0–17.0)
Lymphs Abs: 1.9 10*3/uL (ref 0.7–4.0)
MCH: 29.3 pg (ref 26.0–34.0)
MCHC: 35.1 g/dL (ref 30.0–36.0)
MCV: 83.4 fL (ref 78.0–100.0)
Monocytes Relative: 8 % (ref 3–12)
RBC: 4.99 MIL/uL (ref 4.22–5.81)

## 2012-07-29 MED ORDER — METFORMIN HCL 500 MG PO TABS: 500.0000 mg | ORAL_TABLET | Freq: Two times a day (BID) | ORAL | Status: DC

## 2012-07-29 MED ORDER — INSULIN ASPART 100 UNIT/ML ~~LOC~~ SOLN
15.0000 [IU] | Freq: Once | SUBCUTANEOUS | Status: AC
  Administered 2012-07-29: 15 [IU] via SUBCUTANEOUS
  Filled 2012-07-29: qty 1

## 2012-07-29 NOTE — ED Notes (Signed)
Coughed up blood on Wed night, None since.  Bil lower leg pain  For 1 year.

## 2012-07-29 NOTE — ED Notes (Signed)
Discharge instructions reviewed with pt, questions answered. Pt verbalized understanding.  

## 2012-07-29 NOTE — ED Provider Notes (Signed)
History     CSN: [id number]  Arrival date & time 07/29/12  1324   First MD Initiated Contact with Patient 07/29/12 1512      Chief Complaint  Patient presents with  . Hemoptysis    (Consider location/radiation/quality/duration/timing/severity/associated sxs/prior treatment) Patient is a 52 y.o. male presenting with cough. The history is provided by the patient (pt states he coughed up blood 3 times and he has not been taking his insulin). No language interpreter was used.  Cough Cough characteristics:  Dry Severity:  Mild Onset quality:  Gradual Timing:  Intermittent Chronicity:  New Context: not animal exposure   Relieved by:  Nothing Associated symptoms: no chest pain, no eye discharge, no headaches and no rash     Past Medical History  Diagnosis Date  . Diabetes mellitus   . Hypertension     History reviewed. No pertinent past surgical history.  Family History  Problem Relation Age of Onset  . Diabetes Mother   . Stroke Father   . Hypertension Father   . Diabetes Sister   . Diabetes Son     History  Substance Use Topics  . Smoking status: Current Every Day Smoker    Types: Cigars  . Smokeless tobacco: Not on file  . Alcohol Use: No      Review of Systems  Constitutional: Negative for appetite change and fatigue.  HENT: Negative for congestion, sinus pressure and ear discharge.   Eyes: Negative for discharge.  Respiratory: Positive for cough.   Cardiovascular: Negative for chest pain.  Gastrointestinal: Negative for abdominal pain and diarrhea.  Genitourinary: Negative for frequency and hematuria.  Musculoskeletal: Negative for back pain.  Skin: Negative for rash.  Neurological: Negative for seizures and headaches.  Psychiatric/Behavioral: Negative for hallucinations.    Allergies  Nsaids  Home Medications   Current Outpatient Rx  Name  Route  Sig  Dispense  Refill  . insulin glargine (LANTUS) 100 UNIT/ML injection   Subcutaneous   Inject  45 Units into the skin every morning.         Marland Kitchen lisinopril (PRINIVIL,ZESTRIL) 10 MG tablet   Oral   Take 10 mg by mouth every morning.          . metFORMIN (GLUCOPHAGE) 500 MG tablet   Oral   Take 1 tablet (500 mg total) by mouth 2 (two) times daily with a meal.   60 tablet   0     BP 108/70  Pulse 67  Temp(Src) 98.3 F (36.8 C) (Oral)  Resp 20  Ht 5\' 6"  (1.676 m)  Wt 176 lb (79.833 kg)  BMI 28.42 kg/m2  SpO2 97%  Physical Exam  Constitutional: He is oriented to person, place, and time. He appears well-developed.  HENT:  Head: Normocephalic.  Eyes: Conjunctivae and EOM are normal. No scleral icterus.  Neck: Neck supple. No thyromegaly present.  Cardiovascular: Normal rate and regular rhythm.  Exam reveals no gallop and no friction rub.   No murmur heard. Pulmonary/Chest: No stridor. He has no wheezes. He has no rales. He exhibits no tenderness.  Abdominal: He exhibits no distension. There is no tenderness. There is no rebound.  Musculoskeletal: Normal range of motion. He exhibits no edema.  Lymphadenopathy:    He has no cervical adenopathy.  Neurological: He is oriented to person, place, and time. Coordination normal.  Skin: No rash noted. No erythema.  Psychiatric: He has a normal mood and affect. His behavior is normal.    ED Course  Procedures (including critical care time)  Labs Reviewed  BASIC METABOLIC PANEL - Abnormal; Notable for the following:    Sodium 132 (*)    Glucose, Bld 543 (*)    Creatinine, Ser 1.58 (*)    GFR calc non Af Amer 49 (*)    GFR calc Af Amer 57 (*)    All other components within normal limits  CBC WITH DIFFERENTIAL   Dg Chest 2 View  07/29/2012  *RADIOLOGY REPORT*  Clinical Data: Hemoptysis.  CHEST - 2 VIEW  Comparison: PA and lateral chest 11/11/2010.  Findings: Lungs clear.  Heart size normal.  No pneumothorax or pleural effusion.  No focal bony abnormality.  IMPRESSION: Negative chest.   Original Report Authenticated By:  Holley Dexter, M.D.      1. Hemoptysis   2. Diabetes       MDM          Benny Lennert, MD 07/29/12 [phone]

## 2012-09-07 ENCOUNTER — Ambulatory Visit: Payer: Self-pay | Admitting: Physician Assistant

## 2012-10-05 ENCOUNTER — Encounter: Payer: Self-pay | Admitting: Family Medicine

## 2012-10-05 ENCOUNTER — Ambulatory Visit: Payer: Self-pay | Admitting: Family Medicine

## 2012-10-05 VITALS — BP 139/83 | HR 67 | Temp 98.4°F | Resp 16 | Ht 69.0 in | Wt 167.0 lb

## 2012-10-05 DIAGNOSIS — E1065 Type 1 diabetes mellitus with hyperglycemia: Secondary | ICD-10-CM

## 2012-10-05 DIAGNOSIS — IMO0002 Reserved for concepts with insufficient information to code with codable children: Secondary | ICD-10-CM

## 2012-10-05 DIAGNOSIS — E119 Type 2 diabetes mellitus without complications: Secondary | ICD-10-CM

## 2012-10-05 LAB — POCT GLYCOSYLATED HEMOGLOBIN (HGB A1C): Hemoglobin A1C: 12.9

## 2012-10-05 MED ORDER — LISINOPRIL 10 MG PO TABS
10.0000 mg | ORAL_TABLET | Freq: Every morning | ORAL | Status: DC
Start: 2012-10-05 — End: 2015-02-11

## 2012-10-05 MED ORDER — METFORMIN HCL 500 MG PO TABS: 500.0000 mg | ORAL_TABLET | Freq: Two times a day (BID) | ORAL | Status: DC

## 2012-10-05 MED ORDER — GLIPIZIDE ER 5 MG PO TB24: 5.0000 mg | ORAL_TABLET | Freq: Every day | ORAL | Status: DC

## 2012-10-05 NOTE — Progress Notes (Signed)
Diabetes:   Both feet and finger tips are getting numb with paresthesias 30 # weight loss over the past year. Cramping in right thigh x 2 months when he arises and occasionally during the day. Hasn't checked sugar lately.  Needed to go to ED for metformin Rx (cannot afford insulin) Visioin: weaker eyes No insurance No vision complaints, no polyuria or polydipsia  Objective:  NAD Excellent looking fundi Chest:  Clear Heart:  Reg, no murmur Ext:  No edema, good DP pulses, no skin breaks, paresthesias both finger tips and toes, flat feet, moderated diffuse muscle wasting since last visit  Assessment;  I feel really bad for this gentleman.  He cannot afford his medicines or doctors' visits and is applying for disability.  We'll do the best we can.  Type 2 diabetes mellitus - Plan: lisinopril (PRINIVIL,ZESTRIL) 10 MG tablet, metFORMIN (GLUCOPHAGE) 500 MG tablet, POCT glycosylated hemoglobin (Hb A1C), Comprehensive metabolic panel, glipiZIDE (GLUCOTROL XL) 5 MG 24 hr tablet Recheck 3 months  Signed, Elvina Sidle, MD    Plan:   Start ASA 81 mg daily Start Magnesium OTC  Metformin 500 bid Lisinopril 10 qd

## 2012-10-05 NOTE — Patient Instructions (Signed)
Aspirin 81 mg daily (coated aspirin) daily Magnesium tablet every night

## 2012-10-06 ENCOUNTER — Telehealth: Payer: Self-pay | Admitting: Family Medicine

## 2012-10-06 LAB — COMPREHENSIVE METABOLIC PANEL
ALT: 24 U/L (ref 0–53)
AST: 13 U/L (ref 0–37)
Albumin: 4.1 g/dL (ref 3.5–5.2)
Alkaline Phosphatase: 76 U/L (ref 39–117)
BUN: 12 mg/dL (ref 6–23)
CO2: 25 mEq/L (ref 19–32)
Calcium: 9.5 mg/dL (ref 8.4–10.5)
Chloride: 98 mEq/L (ref 96–112)
Creat: 1.27 mg/dL (ref 0.50–1.35)
Glucose, Bld: 557 mg/dL (ref 70–99)
Potassium: 4.5 mEq/L (ref 3.5–5.3)
Sodium: 132 mEq/L — ABNORMAL LOW (ref 135–145)
Total Bilirubin: 0.8 mg/dL (ref 0.3–1.2)
Total Protein: 6.7 g/dL (ref 6.0–8.3)

## 2012-10-06 NOTE — Telephone Encounter (Signed)
Patient picked up meds and will return for simple blood sugar check on Saturday afternoon.  No blurry vision, change in mental status or polyuria.  He is getting back on meds today.

## 2012-10-08 ENCOUNTER — Ambulatory Visit: Payer: Self-pay | Admitting: Family Medicine

## 2012-10-08 VITALS — BP 108/73 | HR 80 | Temp 98.0°F | Resp 16 | Ht 70.0 in | Wt 169.0 lb

## 2012-10-08 DIAGNOSIS — E119 Type 2 diabetes mellitus without complications: Secondary | ICD-10-CM

## 2012-10-08 DIAGNOSIS — IMO0001 Reserved for inherently not codable concepts without codable children: Secondary | ICD-10-CM

## 2012-10-08 LAB — GLUCOSE, POCT (MANUAL RESULT ENTRY): POC Glucose: 311 mg/dl — AB (ref 70–99)

## 2012-10-08 NOTE — Progress Notes (Signed)
52 year old gentleman with type 2 diabetes who ran out of medicine recently. He's got restarted on his medicine and today is here for blood sugar recheck. No new symptoms  Objective:  Results for orders placed in visit on 10/08/12  GLUCOSE, POCT (MANUAL RESULT ENTRY)      Result Value Range   POC Glucose 311 (*) 70 - 99 mg/dl   Assessment: Improving on oral meds.  Plan: Recheck 1 month. Continue current meds  Signed, Sheila Oats.D.

## 2012-11-01 DIAGNOSIS — Z0271 Encounter for disability determination: Secondary | ICD-10-CM

## 2012-11-04 ENCOUNTER — Telehealth: Payer: Self-pay

## 2012-11-04 NOTE — Telephone Encounter (Signed)
Patient's disability forms in Dr. Loma Boston box to be completed.

## 2012-11-21 ENCOUNTER — Encounter: Payer: Self-pay | Admitting: Family Medicine

## 2012-11-21 NOTE — Telephone Encounter (Signed)
Forms completed. Patient notified

## 2012-12-25 ENCOUNTER — Emergency Department (HOSPITAL_COMMUNITY)
Admission: EM | Admit: 2012-12-25 | Discharge: 2012-12-25 | Disposition: A | Discharge status: Home / Self Care | Payer: Self-pay | Attending: Emergency Medicine | Admitting: Emergency Medicine

## 2012-12-25 ENCOUNTER — Encounter (HOSPITAL_COMMUNITY): Payer: Self-pay | Admitting: *Deleted

## 2012-12-25 DIAGNOSIS — E119 Type 2 diabetes mellitus without complications: Secondary | ICD-10-CM | POA: Insufficient documentation

## 2012-12-25 DIAGNOSIS — Z7982 Long term (current) use of aspirin: Secondary | ICD-10-CM | POA: Insufficient documentation

## 2012-12-25 DIAGNOSIS — M545 Low back pain, unspecified: Secondary | ICD-10-CM | POA: Insufficient documentation

## 2012-12-25 DIAGNOSIS — F17200 Nicotine dependence, unspecified, uncomplicated: Secondary | ICD-10-CM | POA: Insufficient documentation

## 2012-12-25 DIAGNOSIS — M549 Dorsalgia, unspecified: Secondary | ICD-10-CM

## 2012-12-25 DIAGNOSIS — I10 Essential (primary) hypertension: Secondary | ICD-10-CM | POA: Insufficient documentation

## 2012-12-25 DIAGNOSIS — Z79899 Other long term (current) drug therapy: Secondary | ICD-10-CM | POA: Insufficient documentation

## 2012-12-25 NOTE — ED Provider Notes (Signed)
CSN: [id number]     Arrival date & time 12/25/12  1508 History  This chart was scribed for Flint Melter, MD by Karle Plumber, ED Scribe. This patient was seen in room APFT21/APFT21 and the patient's care was started at 3:50 PM.    Chief Complaint  Patient presents with  . Back Pain   The history is provided by the patient. No language interpreter was used.   HPI Comments:  Timothy Costa is a 52 y.o. male who presents to the Emergency Department complaining of sudden, constant lower medial back pain onset yesterday morning. Pt states the pain worsens with bending over. He denies taking anything for pain relief. He denies fever, bowel issues, urinary incontinence. Pt has history of DM in which he takes metformin daily. There are no other known modifying factors   Past Medical History  Diagnosis Date  . Diabetes mellitus   . Hypertension    History reviewed. No pertinent past surgical history. Family History  Problem Relation Age of Onset  . Diabetes Mother   . Stroke Father   . Hypertension Father   . Diabetes Sister   . Diabetes Son    History  Substance Use Topics  . Smoking status: Current Every Day Smoker    Types: Cigars  . Smokeless tobacco: Not on file  . Alcohol Use: No    Review of Systems  Gastrointestinal: Negative for diarrhea and constipation.  All other systems reviewed and are negative.   Allergies  Nsaids  Home Medications   Current Outpatient Rx  Name  Route  Sig  Dispense  Refill  . aspirin EC 81 MG tablet   Oral   Take 81 mg by mouth daily.         Marland Kitchen glipiZIDE (GLUCOTROL XL) 5 MG 24 hr tablet   Oral   Take 1 tablet (5 mg total) by mouth daily.   30 tablet   11   . lisinopril (PRINIVIL,ZESTRIL) 10 MG tablet   Oral   Take 1 tablet (10 mg total) by mouth every morning.   30 tablet   11   . metFORMIN (GLUCOPHAGE) 500 MG tablet   Oral   Take 1 tablet (500 mg total) by mouth 2 (two) times daily with a meal.   60 tablet   11     Triage Vitals: BP 129/91  Pulse 65  Temp(Src) 98.1 F (36.7 C) (Oral)  Resp 20  Ht 5\' 7"  (1.702 m)  Wt 129 lb (58.514 kg)  BMI 20.2 kg/m2  SpO2 100% Physical Exam  Nursing note and vitals reviewed. Constitutional: He is oriented to person, place, and time. He appears well-developed and well-nourished.  HENT:  Head: Normocephalic and atraumatic.  Right Ear: External ear normal.  Left Ear: External ear normal.  Eyes: Conjunctivae and EOM are normal. Pupils are equal, round, and reactive to light.  Neck: Normal range of motion and phonation normal. Neck supple.  Cardiovascular: Normal rate, regular rhythm, normal heart sounds and intact distal pulses.   Pulmonary/Chest: Effort normal and breath sounds normal. He exhibits no bony tenderness.  Abdominal: Soft. Normal appearance. There is no tenderness.  Musculoskeletal: Normal range of motion.  Mild bilateral lumbar spine tenderness and paravertebral tenderness in the lumbar area. Normal ROM of the back.   Neurological: He is alert and oriented to person, place, and time. He has normal strength. No cranial nerve deficit or sensory deficit. He exhibits normal muscle tone. Coordination normal.  Skin: Skin is  warm, dry and intact.  Psychiatric: He has a normal mood and affect. His behavior is normal. Judgment and thought content normal.    ED Course  Procedures (including critical care time) DIAGNOSTIC STUDIES: Oxygen Saturation is 100% on RA, normal by my interpretation.   COORDINATION OF CARE: 3:57 PM- Advised pt to take ibruprofen three times daily and apply hot compresses for relief of pain. Pt verbalizes understanding and agrees to plan.  Medications - No data to display  Labs Review Labs Reviewed  GLUCOSE, CAPILLARY - Abnormal; Notable for the following:    Glucose-Capillary 147 (*)    All other components within normal limits   Imaging Review No results found.  MDM   1. Back pain    Musculoskeletal low back pain  without red flags for cauda equina syndrome. Doubt UTI, nerve impingement or sciatica.  Nursing Notes Reviewed/ Care Coordinated, and agree without changes. Applicable Imaging Reviewed.  Interpretation of Laboratory Data incorporated into ED treatment   Plan: Home Medications- ibuprofen; Home Treatments and Observation- rest, heat; return here if the recommended treatment, does not improve the symptoms; Recommended follow up- PCP of choice when necessary    I personally performed the services described in this documentation, which was scribed in my presence. The recorded information has been reviewed and is accurate.      Flint Melter, MD 12/26/12 [phone]

## 2012-12-25 NOTE — ED Notes (Signed)
Pt states he woke up with lower back pain yesterday morning. Hx of same. NAD.

## 2013-01-02 DIAGNOSIS — Z0271 Encounter for disability determination: Secondary | ICD-10-CM

## 2013-07-03 ENCOUNTER — Emergency Department (HOSPITAL_COMMUNITY)
Admission: EM | Admit: 2013-07-03 | Discharge: 2013-07-03 | Disposition: A | Discharge status: Home / Self Care | Payer: Self-pay | Attending: Emergency Medicine | Admitting: Emergency Medicine

## 2013-07-03 ENCOUNTER — Emergency Department (HOSPITAL_COMMUNITY): Payer: Self-pay

## 2013-07-03 ENCOUNTER — Encounter (HOSPITAL_COMMUNITY): Payer: Self-pay | Admitting: Emergency Medicine

## 2013-07-03 DIAGNOSIS — Z7982 Long term (current) use of aspirin: Secondary | ICD-10-CM | POA: Insufficient documentation

## 2013-07-03 DIAGNOSIS — R079 Chest pain, unspecified: Secondary | ICD-10-CM | POA: Insufficient documentation

## 2013-07-03 DIAGNOSIS — Z794 Long term (current) use of insulin: Secondary | ICD-10-CM | POA: Insufficient documentation

## 2013-07-03 DIAGNOSIS — F17200 Nicotine dependence, unspecified, uncomplicated: Secondary | ICD-10-CM | POA: Insufficient documentation

## 2013-07-03 DIAGNOSIS — E119 Type 2 diabetes mellitus without complications: Secondary | ICD-10-CM | POA: Insufficient documentation

## 2013-07-03 DIAGNOSIS — I10 Essential (primary) hypertension: Secondary | ICD-10-CM | POA: Insufficient documentation

## 2013-07-03 DIAGNOSIS — M25519 Pain in unspecified shoulder: Secondary | ICD-10-CM | POA: Insufficient documentation

## 2013-07-03 DIAGNOSIS — Z79899 Other long term (current) drug therapy: Secondary | ICD-10-CM | POA: Insufficient documentation

## 2013-07-03 DIAGNOSIS — R0602 Shortness of breath: Secondary | ICD-10-CM | POA: Insufficient documentation

## 2013-07-03 LAB — COMPREHENSIVE METABOLIC PANEL
ALT: 32 U/L (ref 0–53)
AST: 37 U/L (ref 0–37)
Albumin: 3.8 g/dL (ref 3.5–5.2)
Alkaline Phosphatase: 73 U/L (ref 39–117)
BUN: 19 mg/dL (ref 6–23)
CO2: 29 mEq/L (ref 19–32)
Calcium: 9.4 mg/dL (ref 8.4–10.5)
Chloride: 101 mEq/L (ref 96–112)
Creatinine, Ser: 1.16 mg/dL (ref 0.50–1.35)
GFR calc Af Amer: 83 mL/min — ABNORMAL LOW (ref >90)
GFR calc non Af Amer: 71 mL/min — ABNORMAL LOW (ref >90)
Glucose, Bld: 249 mg/dL — ABNORMAL HIGH (ref 70–99)
Potassium: 4.2 mEq/L (ref 3.7–5.3)
Sodium: 138 mEq/L (ref 137–147)
Total Bilirubin: 0.5 mg/dL (ref 0.3–1.2)
Total Protein: 7.5 g/dL (ref 6.0–8.3)

## 2013-07-03 LAB — CBC
HCT: 44.6 % (ref 39.0–52.0)
Hemoglobin: 15.3 g/dL (ref 13.0–17.0)
MCH: 29.4 pg (ref 26.0–34.0)
MCHC: 34.3 g/dL (ref 30.0–36.0)
MCV: 85.6 fL (ref 78.0–100.0)
Platelets: 276 10*3/uL (ref 150–400)
RBC: 5.21 MIL/uL (ref 4.22–5.81)
RDW: 13.2 % (ref 11.5–15.5)
WBC: 6.6 10*3/uL (ref 4.0–10.5)

## 2013-07-03 LAB — TROPONIN I: Troponin I: <0.3 ng/mL (ref <0.30)

## 2013-07-03 NOTE — Care Management Note (Signed)
ED/CM noted patient did not have health insurance and/or PCP listed in the computer.  Patient was given the [place] resource handout with information on the clinics, food pantries, and the handout for new health insurance sign-up.  Patient expressed appreciation for information received. 

## 2013-07-03 NOTE — ED Notes (Signed)
Pt states constant, left CP began at 1000. Described as sharp. Also states "aching" to left arm x 1 mo. Denies any other symptoms at this time.

## 2013-07-03 NOTE — ED Notes (Signed)
nad noted prior to dc. Dc instructions reviewed and explained. Voiced understanding.  

## 2013-07-03 NOTE — ED Provider Notes (Signed)
CSN: [id number]     Arrival date & time 07/03/13  1219 History  This chart was scribed for Raeford Razor, MD by Leone Payor, ED Scribe. This patient was seen in room APA01/APA01 and the patient's care was started 1:32 PM.    Chief Complaint  Patient presents with  . Chest Pain      The history is provided by the patient. No language interpreter was used.    HPI Comments: Timothy Costa is a 53 y.o. male with past medical history of DM, HTN who presents to the Emergency Department complaining of 3.5 hours of constant, waxing and waning left sided chest pain that radiates to the left shoulder. He describes the pain as aching currently but states it was sharp initially. He states the pain began when he was at rest. He has associated SOB. He denies any modifying factors. He has not taken any OTC medications for his symptoms. He denies similar chest pain in the past. He reports having a stress test in the past. He denies fever, chills, cough, leg pain, leg swelling. Patient is a regular, daily smoker.   No PCP  Past Medical History  Diagnosis Date  . Diabetes mellitus   . Hypertension    History reviewed. No pertinent past surgical history. Family History  Problem Relation Age of Onset  . Diabetes Mother   . Stroke Father   . Hypertension Father   . Diabetes Sister   . Diabetes Son    History  Substance Use Topics  . Smoking status: Current Every Day Smoker    Types: Cigars  . Smokeless tobacco: Not on file  . Alcohol Use: No    Review of Systems  Constitutional: Negative for fever and chills.  Respiratory: Positive for shortness of breath. Negative for cough.   Cardiovascular: Positive for chest pain. Negative for leg swelling.  All other systems reviewed and are negative.      Allergies  Nsaids  Home Medications   Current Outpatient Rx  Name  Route  Sig  Dispense  Refill  . aspirin EC 81 MG tablet   Oral   Take 81 mg by mouth daily.         . insulin  glargine (LANTUS) 100 UNIT/ML injection   Subcutaneous   Inject into the skin at bedtime.         Marland Kitchen lisinopril (PRINIVIL,ZESTRIL) 10 MG tablet   Oral   Take 1 tablet (10 mg total) by mouth every morning.   30 tablet   11   . metFORMIN (GLUCOPHAGE) 500 MG tablet   Oral   Take 1 tablet (500 mg total) by mouth 2 (two) times daily with a meal.   60 tablet   11    BP 139/93  Pulse 60  Temp(Src) 98.4 F (36.9 C) (Oral)  Resp 16  Ht 5\' 8"  (1.727 m)  Wt 156 lb (70.761 kg)  BMI 23.73 kg/m2  SpO2 99% Physical Exam  Nursing note and vitals reviewed. Constitutional: He is oriented to person, place, and time. He appears well-developed and well-nourished.  HENT:  Head: Normocephalic and atraumatic.  Eyes: EOM are normal.  Neck: Normal range of motion.  Cardiovascular: Normal rate, regular rhythm, normal heart sounds and intact distal pulses.   Pulmonary/Chest: Effort normal and breath sounds normal. No respiratory distress. He exhibits no tenderness.  Abdominal: Soft. He exhibits no distension. There is no tenderness.  Musculoskeletal: Normal range of motion. He exhibits no edema and no tenderness.  No calf tenderness or edema.   Neurological: He is alert and oriented to person, place, and time.  Skin: Skin is warm and dry.  Psychiatric: He has a normal mood and affect. Judgment normal.    ED Course  Procedures (including critical care time)  DIAGNOSTIC STUDIES: Oxygen Saturation is 99% on RA, normal by my interpretation.    COORDINATION OF CARE: 1:35 PM Discussed treatment plan with pt at bedside and pt agreed to plan.   Labs Review Labs Reviewed  CBC  COMPREHENSIVE METABOLIC PANEL  TROPONIN I   Imaging Review No results found.   EKG Interpretation   Date/Time:  Monday July 03 2013 12:29:16 EDT Ventricular Rate:  55 PR Interval:  180 QRS Duration: 92 QT Interval:  406 QTC Calculation: 388 R Axis:   66 Text Interpretation:  Sinus bradycardia Otherwise  normal ECG No previous  ECGs available Confirmed by Margaretta Chittum  MD, Tali Cleaves ([id number]) on 07/03/2013 1:09:27  PM      MDM   Final diagnoses:  Chest pain    52yM with atypical CP. Pleuritic. Reproducible. Low risk for PE based on Well's criteria and only PERC criteria is age. Low clinical suspicion. Doubt ACS, dissection or other potentially emergent etiology. Unfortunately has financial constraints. Needs outpt FU. Return precautions discussed.   I personally preformed the services scribed in my presence. The recorded information has been reviewed is accurate. Raeford RazorStephen Bader Stubblefield, MD.   Raeford RazorStephen Amneet Cendejas, MD 07/03/13 [phone]

## 2013-07-03 NOTE — Discharge Instructions (Signed)
°Emergency Department Resource Guide °1) Find a Doctor and Pay Out of Pocket °Although you won't have to find out who is covered by your insurance plan, it is a good idea to ask around and get recommendations. You will then need to call the office and see if the doctor you have chosen will accept you as a new patient and what types of options they offer for patients who are self-pay. Some doctors offer discounts or will set up payment plans for their patients who do not have insurance, but you will need to ask so you aren't surprised when you get to your appointment. ° °2) Contact Your Local Health Department °Not all health departments have doctors that can see patients for sick visits, but many do, so it is worth a call to see if yours does. If you don't know where your local health department is, you can check in your phone book. The CDC also has a tool to help you locate your state's health department, and many state websites also have listings of all of their local health departments. ° °3) Find a Walk-in Clinic °If your illness is not likely to be very severe or complicated, you may want to try a walk in clinic. These are popping up all over the country in pharmacies, drugstores, and shopping centers. They're usually staffed by nurse practitioners or physician assistants that have been trained to treat common illnesses and complaints. They're usually fairly quick and inexpensive. However, if you have serious medical issues or chronic medical problems, these are probably not your best option. ° °No Primary Care Doctor: °- Call [facility] at  832-8000 - they can help you locate a primary care doctor that  accepts your insurance, provides certain services, etc. °- Physician Referral Service- 1-800-533-3463 ° °Chronic Pain Problems: °Organization         Address  Phone   Notes  °[facility]  (336) 297-2271 Patients need to be referred by their primary care doctor.  ° °Medication  Assistance: °Organization         Address  Phone   Notes  °[place] [facility] [address] °[place], [place] [address] (336) 641-8030 --Must be a resident of [place] °-- Must have NO insurance coverage whatsoever (no Medicaid/ Medicare, etc.) °-- The pt. MUST have a primary care doctor that directs their care regularly and follows them in the community °  °[facility]  (866) 331-1348   °[employer]  (888) 892-1162   ° °Agencies that provide inexpensive medical care: °Organization         Address  Phone   Notes  °[facility]  (336) 832-8035   °[facility]    (336) 832-7272   °[facility] [address] °[place], [place] [address] (336) 832-4777   °[facility] [address], °[place] (336) 271-4999   °[facility]    (336) 373-0678   °[facility]    (336) 272-1050   °[facility] ° [address], [place] Phone:  (336) 832-4444, Fax:  (336) 832-4440 Hours of Operation:  9 am - 6 pm, M-F.  Also accepts Medicaid/Medicare and self-pay.  ° [facility] ° [address], [place] Phone: (336) 832-3150, Fax: (336) 832-3151. Hours of Operation:  8:30 am - 5:30 pm, M-F.  Also accepts Medicaid and self-pay.  °[facility] [place] [address], [place] Phone: (336) 878-6027   °[facility] [address], [place], [place] (336)723-1848, Ext. 123 Mondays & Thursdays: 7-9 AM.  First 15 patients are seen on a first come, first serve basis. °  ° °Medicaid-accepting [place] Providers: ° °Organization         Address  Phone   Notes  °[facility] [address], [place] (336) 641-2100 Also accepts self-pay patients.  °[facility] [address], [place] ° (336) 856-9996   °[facility] [address], [place]  (336) 288-8857   °[facility] [address] [place] [address], [place] (336) 299-7000   °Veita Bland [address], [place]  ° (336) 373-1557 Only accepts [place] Access Medicaid patients after they have their name applied to their card.  ° °Self-Pay (no insurance) in [place]: ° °Organization         Address  Phone   Notes  °Sickle Cell Patients, [facility] [address], [place] (336) 832-1970   °[facility] [address], [place] (336) 832-4400   °[facility] ° [address], [place] (336) 992-4800   °[facility]/Dr. Osei-Bonsu ° [address] [place] [address], [place] or [address], [place] (336) 841-8500 Phone number for both [place] and [place] locations is the same.  °[facility] [address], [place] (336) 299-0000   °[facility] [address] [place] [address], [place] or [address] (336) 852-7530 °(336) 878-2260   °[facility] [address], [place] (336) 350-1642, phone; (336) 294-5005, fax Sees patients 1st and 3rd Saturday of every month.  Must not qualify for public or private insurance (i.e. Medicaid, Medicare, [place] Health Choice, Veterans' Benefits) • Household income should be no more than 200% of the poverty level •The clinic cannot treat you if you are pregnant or think you are pregnant • Sexually transmitted diseases are not treated at the clinic.  ° ° °Dental Care: °Organization         Address  Phone  Notes  °[facility] [place] [facility] [address], [place] (336) 641-6152 Accepts children up to age 21 who are enrolled in Medicaid or [place] Health Choice; pregnant women with a Medicaid card; and children who have applied for Medicaid or [place] Health Choice, but were declined, whose parents can pay a reduced fee at time of service.  °[place]  [facility] [place]  [address], [place] (336) 641-7733 Accepts children up to age 21 who are enrolled in Medicaid or [place] Health Choice; pregnant women with a Medicaid card; and children who have applied for Medicaid or  Health Choice, but were declined, whose parents can pay a reduced fee at time of service.  °[facility] ° [address], [place] (336) 641-4533 Patients are seen by appointment only. Walk-ins are not accepted. [facility] will see patients 18 years of age and older. °Monday - Tuesday (8am-5pm) °Most Wednesdays (8:30-5pm) °$30 per visit, cash only  °[facility] ° [address], [place] (336) 641-4533 Patients are seen by appointment only. Walk-ins are not accepted. [facility] will see patients 18 years of age and older. °One   Wednesday Evening (Monthly: Volunteer Based).  $30 per visit, cash only  °[facility]  (919) 537-3737 for adults; Children under age 4, call Graduate Pediatric Dentistry at ([phone]) [phone]. Children aged 4-14, please call (919) [phone] to request a pediatric application. ° Dental services are provided in all areas of dental care including fillings, crowns and bridges, complete and partial dentures, implants, gum treatment, root canals, and extractions. Preventive care is also provided. Treatment is provided to both adults and children. °Patients are selected via a lottery and there is often a waiting list. °  °[facility] [address], °[place] ° (336) 763-8833 [email or web address] °  °[facility] [address], [place], [place] (336)723-1848, Ext. 123 Second and Fourth Thursday of each month, opens at 6:30 AM; Clinic ends at 9 AM.  Patients are seen on a first-come first-served basis, and a limited number are seen during each clinic.  ° °[facility] ° [address], [place], [place] (336) 723-7904    Eligibility Requirements °You must have lived in [place], [place], or [place] counties for at least the last three months. °  You cannot be eligible for state or federal sponsored healthcare insurance, including Veterans Administration, Medicaid, or Medicare. °  You generally cannot be eligible for healthcare insurance through your employer.  °  How to apply: °Eligibility screenings are held every Tuesday and Wednesday afternoon from 1:00 pm until 4:00 pm. You do not need an appointment for the interview!  °[facility] [address], [place], [place] 336-631-2330   °[facility]  336-342-8273   °[facility]  336-703-3100   °[facility]  336-570-6415   ° °Behavioral Health Resources in the Community: °Intensive Outpatient Programs °Organization         Address  Phone  Notes  °[facility] [address], [place], [place] 336-878-6098   °[facility] [address], [place], [place] 336-832-9800   °[facility] [address], [place], [place] ° 336-882-2125   °[facility] [address],  °[place], [place] 1-800-853-5163 or 336-641-4981   °Substance Abuse Resources °Organization         Address  Phone  Notes  °[facility]  336-882-2125   °[facility]  336-784-9470   °[facility]  336-285-9073   °[facility]  336-845-3988   °Residential & Outpatient Substance Abuse Program  1-800-659-3381   °Psychological Services °Organization         Address  Phone  Notes  °[facility]  336- 832-9600   °[facility]  336- 378-7881   °[facility] [address], [place] 1-800-853-5163 or 336-641-4981   ° °Mobile Crisis Teams °Organization         Address  Phone  Notes  °[facility], Mobile Crisis Care Unit  1-877-626-1772   °Assertive °[facility] ° [address]  [place], [place] 336-834-9664   °Sharon DeEsch [address] °[place] 336-554-5454   ° °Self-Help/Support Groups °Organization         Address  Phone             Notes  °[facility] - variety of support groups  336- 373-1402 Call for more information  °Narcotics Anonymous (NA), [facility] [address], °[place]  2 meetings at this location  ° °  Residential Treatment Programs °Organization         Address  Phone  Notes  °[facility] [address],    °[place]  1-866-801-8205   °[facility] ° [address], [place], [place] 704-293-8524   °[facility] [address], [place] 336-845-3988 Admissions: 8am-3pm M-F  °[facility] [address]    °[place],  [place] 336-841-1104   °[facility] [address], [place], [place] 336-379-7146   °[facility] [address]  °[place], [place] 336-285-9073   °[facility] - Intensive Outpatient [address], [place], [place] 336-852-3033   °[facility] [address]  °[place], [place] 1-877-615-2722 or 336-784-9470   °[facility] [address] [place], [place] 336-227-7417 Accepts Medicaid  °[facility] [address]  °[place] 1-800-659-3381 Substance Abuse/Addiction Treatment  ° °[place] Behavioral Health Resources °Organization         Address  Phone  Notes  °[facility]  (888) 581-9988   °Julie Brannon, PhD [address] [place], [place]   (336) 349-5553 or (336) 951-0000   °[facility]   [address] °[place], [place] (336) 349-4454   °[facility] [address], [place], [place] (336) 342-8316 Insurance/Medicaid/sponsorship through [facility]  °[facility] [address]                                    [place], [place] (336) 342-8316 Therapy/tele-psych/case    °[facility] [address]  ° [place], [place] (336) 349-2233    °Dr. Arfeen  (336) 349-4544   °[facility] [place]  [employer] [place] [facility] 1) [address], [place] °2) [address], [place] °3)  [address], [place] (336) 349-3220 °(336) 342-7768 ° °(336) 342-8140   °[place] [facility] (336) 342-1394 or (336) 342-3537 (After Hours)    ° ° °

## 2013-07-25 ENCOUNTER — Emergency Department (HOSPITAL_COMMUNITY)
Admission: EM | Admit: 2013-07-25 | Discharge: 2013-07-25 | Disposition: A | Discharge status: Home / Self Care | Payer: Self-pay | Attending: Emergency Medicine | Admitting: Emergency Medicine

## 2013-07-25 ENCOUNTER — Encounter (HOSPITAL_COMMUNITY): Payer: Self-pay | Admitting: Emergency Medicine

## 2013-07-25 DIAGNOSIS — Z794 Long term (current) use of insulin: Secondary | ICD-10-CM | POA: Insufficient documentation

## 2013-07-25 DIAGNOSIS — E119 Type 2 diabetes mellitus without complications: Secondary | ICD-10-CM | POA: Insufficient documentation

## 2013-07-25 DIAGNOSIS — T7840XA Allergy, unspecified, initial encounter: Secondary | ICD-10-CM

## 2013-07-25 DIAGNOSIS — I10 Essential (primary) hypertension: Secondary | ICD-10-CM | POA: Insufficient documentation

## 2013-07-25 DIAGNOSIS — T783XXA Angioneurotic edema, initial encounter: Secondary | ICD-10-CM | POA: Insufficient documentation

## 2013-07-25 DIAGNOSIS — F17200 Nicotine dependence, unspecified, uncomplicated: Secondary | ICD-10-CM | POA: Insufficient documentation

## 2013-07-25 DIAGNOSIS — T4995XA Adverse effect of unspecified topical agent, initial encounter: Secondary | ICD-10-CM | POA: Insufficient documentation

## 2013-07-25 DIAGNOSIS — Z7982 Long term (current) use of aspirin: Secondary | ICD-10-CM | POA: Insufficient documentation

## 2013-07-25 DIAGNOSIS — L500 Allergic urticaria: Secondary | ICD-10-CM | POA: Insufficient documentation

## 2013-07-25 DIAGNOSIS — Z79899 Other long term (current) drug therapy: Secondary | ICD-10-CM | POA: Insufficient documentation

## 2013-07-25 LAB — CBG MONITORING, ED: Glucose-Capillary: 143 mg/dL — ABNORMAL HIGH (ref 70–99)

## 2013-07-25 MED ORDER — FAMOTIDINE 20 MG PO TABS
20.0000 mg | ORAL_TABLET | Freq: Once | ORAL | Status: AC
  Administered 2013-07-25: 20 mg via ORAL
  Filled 2013-07-25: qty 1

## 2013-07-25 MED ORDER — PREDNISONE 50 MG PO TABS
60.0000 mg | ORAL_TABLET | Freq: Every day | ORAL | Status: DC
  Administered 2013-07-25: 60 mg via ORAL
  Filled 2013-07-25 (×2): qty 1

## 2013-07-25 MED ORDER — FAMOTIDINE 20 MG PO TABS: 20.0000 mg | ORAL_TABLET | Freq: Two times a day (BID) | ORAL | Status: DC

## 2013-07-25 MED ORDER — PREDNISONE 20 MG PO TABS: ORAL_TABLET | ORAL | Status: DC

## 2013-07-25 MED ORDER — DIPHENHYDRAMINE HCL 25 MG PO TABS: 25.0000 mg | ORAL_TABLET | Freq: Four times a day (QID) | ORAL | Status: DC | PRN

## 2013-07-25 NOTE — Discharge Instructions (Signed)
Angioedema Angioedema is a sudden swelling of tissues, often of the skin. It can occur on the face or genitals or in the abdomen or other body parts. The swelling usually develops over a short period and gets better in 24 to 48 hours. It often begins during the night and is found when the person wakes up. The person may also get red, itchy patches of skin (hives). Angioedema can be dangerous if it involves swelling of the air passages.  Depending on the cause, episodes of angioedema may only happen once, come back in unpredictable patterns, or repeat for several years and then gradually fade away.  CAUSES  Angioedema can be caused by an allergic reaction to various triggers. It can also result from nonallergic causes, including reactions to drugs, immune system disorders, viral infections, or an abnormal gene that is passed to you from your parents (hereditary). For some people with angioedema, the cause is unknown.  Some things that can trigger angioedema include:   Foods.   Medicines, such as ACE inhibitors, ARBs, nonsteroidal anti-inflammatory agents, or estrogen.   Latex.   Animal saliva.   Insect stings.   Dyes used in X-rays.   Mild injury.   Dental work.  Surgery.  Stress.   Sudden changes in temperature.   Exercise. SIGNS AND SYMPTOMS   Swelling of the skin.  Hives. If these are present, there is also intense itching.  Redness in the affected area.   Pain in the affected area.  Swollen lips or tongue.  Breathing problems. This may happen if the air passages swell.  Wheezing. If internal organs are involved, there may be:   Nausea.   Abdominal pain.   Vomiting.   Difficulty swallowing.   Difficulty passing urine. DIAGNOSIS   Your health care provider will examine the affected area and take a medical and family history.  Various tests may be done to help determine the cause. Tests may include:  Allergy skin tests to see if the problem  is an allergic reaction.   Blood tests to check for hereditary angioedema.   Tests to check for underlying diseases that could cause the condition.   A review of your medicines, including over the counter medicines, may be done. TREATMENT  Treatment will depend on the cause of the angioedema. Possible treatments include:   Removal of anything that triggered the condition (such as stopping certain medicines).   Medicines to treat symptoms or prevent attacks. Medicines given may include:   Antihistamines.   Epinephrine injection.   Steroids.   Hospitalization may be required for severe attacks. If the air passages are affected, it can be an emergency. Tubes may need to be placed to keep the airway open. HOME CARE INSTRUCTIONS   Only take over-the-counter or prescription medicines as directed by your health care provider.  If you were given medicines for emergency allergy treatment, always carry them with you.  Wear a medical bracelet as directed by your health care provider.   Avoid known triggers. SEEK MEDICAL CARE IF:   You have repeat attacks of angioedema.   Your attacks are more frequent or more severe despite preventive measures.   You have hereditary angioedema and are considering having children. It is important to discuss the risks of passing the condition on to your children with your health care provider. SEEK IMMEDIATE MEDICAL CARE IF:   You have severe swelling of the mouth, tongue, or lips.  You have difficulty breathing.   You have difficulty swallowing.  You faint. MAKE SURE YOU:  Understand these instructions.  Will watch your condition.  Will get help right away if you are not doing well or get worse. Document Released: 06/08/2001 Document Revised: 01/18/2013 Document Reviewed: 11/21/2012 [facility] Patient Information 2014 [place], [place].   Take your next dose of prednisone tomorrow evening, your next dose of Pepcid tomorrow  morning.  You may take the Benadryl as directed only if you are itching.  Make sure to check your blood sugars daily while you are on prednisone as this can cause increased blood glucose levels.  Return here for any worsened symptoms, particularly return of your lips or tongue swelling.  Stopped taking your lisinopril until you have spoken with your primary Dr., please call him tomorrow to discuss.  He may want to switch to a different blood pressure medication, as it is possible your symptoms are a side effect of this medicine.

## 2013-07-25 NOTE — ED Notes (Signed)
Patient c/o rash on entire body. Per patient no pain just itching. Denies any new foods, medications, soap, etc. Denies any drainage.

## 2013-07-28 NOTE — ED Provider Notes (Signed)
CSN: [id number]     Arrival date & time 07/25/13  1622 History   First MD Initiated Contact with Patient 07/25/13 1803     Chief Complaint  Patient presents with  . Rash     (Consider location/radiation/quality/duration/timing/severity/associated sxs/prior Treatment) HPI Comments: Timothy Costa is a 53 y.o. Male presenting with a now 3 hour history of generalized itching and rash, which started on his arms,  But spread to his chest, abdomen and upper back along with sensation of tongue numbness, but no tongue swelling and mild upper lip swelling.  He denies any recollection of being exposed to new foods, soaps, chemicals or medications.  He denies wheezing, cough, shortness of breath, or sensation of swollen throat and denies history of prior similar symptoms.  He takes lisinopril which has been unchanged in over a year and denies any new medications.       The history is provided by the patient.    Past Medical History  Diagnosis Date  . Diabetes mellitus   . Hypertension    History reviewed. No pertinent past surgical history. Family History  Problem Relation Age of Onset  . Diabetes Mother   . Stroke Father   . Hypertension Father   . Diabetes Sister   . Diabetes Son    History  Substance Use Topics  . Smoking status: Current Every Day Smoker -- 15 years    Types: Cigars  . Smokeless tobacco: Never Used  . Alcohol Use: No    Review of Systems  Constitutional: Negative.  Negative for fever and chills.  HENT: Positive for facial swelling. Negative for mouth sores, sore throat, trouble swallowing and voice change.   Eyes: Negative for redness and itching.  Respiratory: Negative for cough, chest tightness, shortness of breath and wheezing.   Musculoskeletal: Negative.   Skin: Positive for rash.  Neurological: Negative for numbness.      Allergies  Nsaids  Home Medications   Prior to Admission medications   Medication Sig Start Date End Date Taking?  Authorizing Provider  aspirin EC 81 MG tablet Take 81 mg by mouth daily.    Historical Provider, MD  diphenhydrAMINE (BENADRYL) 25 MG tablet Take 1 tablet (25 mg total) by mouth every 6 (six) hours as needed for itching. 07/25/13   Burgess AmorJulie Jodelle Fausto, PA-C  famotidine (PEPCID) 20 MG tablet Take 1 tablet (20 mg total) by mouth 2 (two) times daily. 07/25/13   Burgess AmorJulie Rhyder Koegel, PA-C  insulin glargine (LANTUS) 100 UNIT/ML injection Inject into the skin at bedtime.    Historical Provider, MD  lisinopril (PRINIVIL,ZESTRIL) 10 MG tablet Take 1 tablet (10 mg total) by mouth every morning. 10/05/12   Elvina SidleKurt Lauenstein, MD  metFORMIN (GLUCOPHAGE) 500 MG tablet Take 1 tablet (500 mg total) by mouth 2 (two) times daily with a meal. 10/05/12   Elvina SidleKurt Lauenstein, MD  predniSONE (DELTASONE) 20 MG tablet Take 2 tablets daily for 4 days. 07/25/13   Burgess AmorJulie Chriss Mannan, PA-C   BP 117/75  Pulse 83  Temp(Src) 98.5 F (36.9 C) (Oral)  Resp 16  Ht 5\' 8"  (1.727 m)  Wt 160 lb (72.576 kg)  BMI 24.33 kg/m2  SpO2 98% Physical Exam  Constitutional: He appears well-developed and well-nourished. No distress.  HENT:  Head: Normocephalic.  Nose: Nose normal.  Mouth/Throat: Uvula is midline, oropharynx is clear and moist and mucous membranes are normal. No uvula swelling. No posterior oropharyngeal edema or posterior oropharyngeal erythema.  No appreciable lip swelling.  Neck: Neck supple.  Cardiovascular: Normal rate.   Pulmonary/Chest: Effort normal. He has no decreased breath sounds. He has no wheezes.  Musculoskeletal: Normal range of motion. He exhibits no edema.  Skin: Rash noted. No purpura noted. Rash is maculopapular and urticarial. Rash is not pustular and not vesicular.  Few areas of urticarial type lesions on upper back, but faint.  Smaller, erythematous maculopapular rash on forearms, antecubital spaces and upper arms.  Abdomen and chest clear.      ED Course  Procedures (including critical care time) Labs Review Labs Reviewed   CBG MONITORING, ED - Abnormal; Notable for the following:    Glucose-Capillary 143 (*)    All other components within normal limits  CBG MONITORING, ED    Imaging Review No results found.   EKG Interpretation None      MDM   Final diagnoses:  Allergic reaction  Angioedema    Angioedema by patients prescription, exam without significant facial or mouth, lip edema.  Pt was given benadryl and pepcid,  Prednisone while here.  He was observed with no improvement in rash, no worsened sx.  Prescribed prednisone, pepcid,  Pt has benadryl.  Encouraged return here for any worsened sx and contact pcp in am re. Continued use of lisinopril in the event this is the cause of his sx.  Pt understands plan and is stable at time of dc.    Burgess AmorJulie Belinda Bringhurst, PA-C 07/28/13 1627

## 2013-07-30 NOTE — ED Provider Notes (Signed)
Medical screening examination/treatment/procedure(s) were performed by non-physician practitioner and as supervising physician I was immediately available for consultation/collaboration.  Kaylene Dawn M Demarqus Jocson, MD 07/30/13 0151 

## 2013-10-24 ENCOUNTER — Encounter (HOSPITAL_COMMUNITY): Payer: Self-pay | Admitting: Emergency Medicine

## 2013-10-24 ENCOUNTER — Emergency Department (HOSPITAL_COMMUNITY)
Admission: EM | Admit: 2013-10-24 | Discharge: 2013-10-25 | Disposition: A | Discharge status: Home / Self Care | Payer: Self-pay | Attending: Emergency Medicine | Admitting: Emergency Medicine

## 2013-10-24 DIAGNOSIS — Z87891 Personal history of nicotine dependence: Secondary | ICD-10-CM | POA: Insufficient documentation

## 2013-10-24 DIAGNOSIS — K0889 Other specified disorders of teeth and supporting structures: Secondary | ICD-10-CM

## 2013-10-24 DIAGNOSIS — K089 Disorder of teeth and supporting structures, unspecified: Secondary | ICD-10-CM | POA: Insufficient documentation

## 2013-10-24 DIAGNOSIS — Z794 Long term (current) use of insulin: Secondary | ICD-10-CM | POA: Insufficient documentation

## 2013-10-24 DIAGNOSIS — I10 Essential (primary) hypertension: Secondary | ICD-10-CM | POA: Insufficient documentation

## 2013-10-24 DIAGNOSIS — Z79899 Other long term (current) drug therapy: Secondary | ICD-10-CM | POA: Insufficient documentation

## 2013-10-24 DIAGNOSIS — E119 Type 2 diabetes mellitus without complications: Secondary | ICD-10-CM | POA: Insufficient documentation

## 2013-10-24 DIAGNOSIS — K006 Disturbances in tooth eruption: Secondary | ICD-10-CM | POA: Insufficient documentation

## 2013-10-24 DIAGNOSIS — Z7982 Long term (current) use of aspirin: Secondary | ICD-10-CM | POA: Insufficient documentation

## 2013-10-24 NOTE — ED Notes (Signed)
Pt c/o dental pain x 2 months.

## 2013-10-25 MED ORDER — TRAMADOL HCL 50 MG PO TABS: 50.0000 mg | ORAL_TABLET | Freq: Four times a day (QID) | ORAL | Status: DC | PRN

## 2013-10-25 MED ORDER — AMOXICILLIN 500 MG PO CAPS: 500.0000 mg | ORAL_CAPSULE | Freq: Three times a day (TID) | ORAL | Status: AC

## 2013-10-25 NOTE — Discharge Instructions (Signed)
Dental Pain  Toothache is pain in or around a tooth. It may get worse with chewing or with cold or heat.   HOME CARE  · Your dentist may use a numbing medicine during treatment. If so, you may need to avoid eating until the medicine wears off. Ask your dentist about this.  · Only take medicine as told by your dentist or doctor.  · Avoid chewing food near the painful tooth until after all treatment is done. Ask your dentist about this.  GET HELP RIGHT AWAY IF:   · The problem gets worse or new problems appear.  · You have a fever.  · There is redness and puffiness (swelling) of the face, jaw, or neck.  · You cannot open your mouth.  · There is pain in the jaw.  · There is very bad pain that is not helped by medicine.  MAKE SURE YOU:   · Understand these instructions.  · Will watch your condition.  · Will get help right away if you are not doing well or get worse.  Document Released: 09/16/2007 Document Revised: 06/22/2011 Document Reviewed: 09/16/2007  ExitCare® Patient Information ©2015 Exit[employer], LLC. This information is not intended to replace advice given to you by your health care provider. Make sure you discuss any questions you have with your health care provider.

## 2013-10-25 NOTE — ED Provider Notes (Signed)
CSN: [id number]     Arrival date & time 10/24/13  2336 History   First MD Initiated Contact with Patient 10/24/13 2347     Chief Complaint  Patient presents with  . Dental Pain     (Consider location/radiation/quality/duration/timing/severity/associated sxs/prior Treatment) Patient is a 53 y.o. male presenting with tooth pain. The history is provided by the patient.  Dental Pain Location:  Upper Upper teeth location:  14/LU 1st molar Quality:  Aching, sharp and shooting Severity:  Moderate Onset quality:  Gradual Duration: has had waxing and waning pain for 2 months, worsened tonight. Timing:  Constant Progression:  Worsening Chronicity:  Chronic Context: poor dentition   Prior workup: none.  [facility]. Relieved by:  Nothing Worsened by:  Pressure Ineffective treatments:  Topical anesthetic gel Associated symptoms: no difficulty swallowing, no facial pain, no facial swelling, no fever, no gum swelling, no neck pain, no neck swelling, no oral lesions and no trismus     Past Medical History  Diagnosis Date  . Diabetes mellitus   . Hypertension    History reviewed. No pertinent past surgical history. Family History  Problem Relation Age of Onset  . Diabetes Mother   . Stroke Father   . Hypertension Father   . Diabetes Sister   . Diabetes Son    History  Substance Use Topics  . Smoking status: Former Smoker -- 15 years    Types: Cigars    Quit date: 10/10/2013  . Smokeless tobacco: Never Used  . Alcohol Use: No    Review of Systems  Constitutional: Negative for fever.  HENT: Positive for dental problem. Negative for facial swelling, mouth sores and sore throat.   Respiratory: Negative for shortness of breath.   Musculoskeletal: Negative for neck pain and neck stiffness.      Allergies  Lisinopril and Nsaids  Home Medications   Prior to Admission medications   Medication Sig Start Date End Date Taking?  Authorizing Provider  aspirin EC 81 MG tablet Take 81 mg by mouth daily.   Yes Historical Provider, MD  metFORMIN (GLUCOPHAGE) 500 MG tablet Take 1 tablet (500 mg total) by mouth 2 (two) times daily with a meal. 10/05/12  Yes Elvina Sidle, MD  amoxicillin (AMOXIL) 500 MG capsule Take 1 capsule (500 mg total) by mouth 3 (three) times daily. 10/25/13 11/04/13  Burgess Amor, PA-C  diphenhydrAMINE (BENADRYL) 25 MG tablet Take 1 tablet (25 mg total) by mouth every 6 (six) hours as needed for itching. 07/25/13   Burgess Amor, PA-C  famotidine (PEPCID) 20 MG tablet Take 1 tablet (20 mg total) by mouth 2 (two) times daily. 07/25/13   Burgess Amor, PA-C  insulin glargine (LANTUS) 100 UNIT/ML injection Inject into the skin at bedtime.    Historical Provider, MD  lisinopril (PRINIVIL,ZESTRIL) 10 MG tablet Take 1 tablet (10 mg total) by mouth every morning. 10/05/12   Elvina Sidle, MD  predniSONE (DELTASONE) 20 MG tablet Take 2 tablets daily for 4 days. 07/25/13   Burgess Amor, PA-C  traMADol (ULTRAM) 50 MG tablet Take 1 tablet (50 mg total) by mouth every 6 (six) hours as needed. 10/25/13   Burgess Amor, PA-C   BP 140/95  Pulse 63  Temp(Src) 98.8 F (37.1 C) (Oral)  Resp 20  Ht 5\' 8"  (1.727 m)  Wt 160 lb (72.576 kg)  BMI 24.33 kg/m2  SpO2 98% Physical Exam  Constitutional: He is oriented to person, place, and  time. He appears well-developed and well-nourished. No distress.  HENT:  Head: Normocephalic and atraumatic.  Right Ear: Tympanic membrane and external ear normal.  Left Ear: Tympanic membrane and external ear normal.  Mouth/Throat: Oropharynx is clear and moist and mucous membranes are normal. No oral lesions. No trismus in the jaw. Abnormal dentition. No oropharyngeal exudate, posterior oropharyngeal edema, posterior oropharyngeal erythema or tonsillar abscesses.    Eyes: Conjunctivae are normal.  Neck: Normal range of motion. Neck supple.  Cardiovascular: Normal rate.   Musculoskeletal: Normal range  of motion.  Lymphadenopathy:    He has no cervical adenopathy.  Neurological: He is alert and oriented to person, place, and time.  Skin: Skin is warm and dry. No erythema.  Psychiatric: He has a normal mood and affect.    ED Course  Procedures (including critical care time) Labs Review Labs Reviewed - No data to display  Imaging Review No results found.   EKG Interpretation None      MDM   Final diagnoses:  Pain, dental    No obvious dental abscess or infection.  He was prescribed ultram for pain.  Prescribed amoxil, but advised to hold this prescription,  Only start abx if he develop gum swelling or drainage.    Burgess AmorJulie Ersilia Brawley, PA-C 10/25/13 [id number]

## 2013-10-25 NOTE — ED Notes (Signed)
Patient evaluated by Leona SingletonJ Idol, PA prior to RN assessment.  Discharge instructions given and reviewed with patient.  Prescriptions given for Tramadol and Amoxil; effects and use explained.  Patient verbalized understanding to complete all antibiotic and sedating effects of pain medication.  Patient ambulatory; discharged home in good condition.

## 2013-10-25 NOTE — ED Provider Notes (Signed)
Medical screening examination/treatment/procedure(s) were performed by non-physician practitioner and as supervising physician I was immediately available for consultation/collaboration.   Edwyna Dangerfield, MD 10/25/13 0546 

## 2013-10-27 ENCOUNTER — Encounter (HOSPITAL_COMMUNITY): Payer: Self-pay | Admitting: Emergency Medicine

## 2013-10-27 ENCOUNTER — Emergency Department (HOSPITAL_COMMUNITY)
Admission: EM | Admit: 2013-10-27 | Discharge: 2013-10-27 | Disposition: A | Discharge status: Home / Self Care | Payer: Self-pay | Attending: Emergency Medicine | Admitting: Emergency Medicine

## 2013-10-27 DIAGNOSIS — Z79899 Other long term (current) drug therapy: Secondary | ICD-10-CM | POA: Insufficient documentation

## 2013-10-27 DIAGNOSIS — Z792 Long term (current) use of antibiotics: Secondary | ICD-10-CM | POA: Insufficient documentation

## 2013-10-27 DIAGNOSIS — H9209 Otalgia, unspecified ear: Secondary | ICD-10-CM | POA: Insufficient documentation

## 2013-10-27 DIAGNOSIS — IMO0002 Reserved for concepts with insufficient information to code with codable children: Secondary | ICD-10-CM | POA: Insufficient documentation

## 2013-10-27 DIAGNOSIS — K006 Disturbances in tooth eruption: Secondary | ICD-10-CM | POA: Insufficient documentation

## 2013-10-27 DIAGNOSIS — I10 Essential (primary) hypertension: Secondary | ICD-10-CM | POA: Insufficient documentation

## 2013-10-27 DIAGNOSIS — K029 Dental caries, unspecified: Secondary | ICD-10-CM | POA: Insufficient documentation

## 2013-10-27 DIAGNOSIS — E119 Type 2 diabetes mellitus without complications: Secondary | ICD-10-CM | POA: Insufficient documentation

## 2013-10-27 DIAGNOSIS — Z7982 Long term (current) use of aspirin: Secondary | ICD-10-CM | POA: Insufficient documentation

## 2013-10-27 DIAGNOSIS — Z87891 Personal history of nicotine dependence: Secondary | ICD-10-CM | POA: Insufficient documentation

## 2013-10-27 DIAGNOSIS — R51 Headache: Secondary | ICD-10-CM | POA: Insufficient documentation

## 2013-10-27 DIAGNOSIS — K089 Disorder of teeth and supporting structures, unspecified: Secondary | ICD-10-CM | POA: Insufficient documentation

## 2013-10-27 MED ORDER — HYDROCODONE-ACETAMINOPHEN 5-325 MG PO TABS
1.0000 | ORAL_TABLET | Freq: Four times a day (QID) | ORAL | Status: DC | PRN
Start: 2013-10-27 — End: 2015-02-11

## 2013-10-27 NOTE — ED Notes (Signed)
Toothache for one month

## 2013-10-27 NOTE — Discharge Instructions (Signed)
1. Medications: vicodin, usual home medications including your antibiotic 2. Treatment: rest, drink plenty of fluids,  3. Follow Up: Please followup with your primary doctor for discussion of your diagnoses and further evaluation after today's visit; if you do not have a primary care doctor use the resource guide provided to find one; please follow-up with your dentist    Dental Caries  Dental caries (also called tooth decay) is the most common oral disease. It can occur at any age, but is more common in children and young adults.  HOW DENTAL CARIES DEVELOPS  The process of decay begins when bacteria and foods (particularly sugars and starches) combine in your mouth to produce plaque. Plaque is a substance that sticks to the hard, outer surface of a tooth (enamel). The bacteria in plaque produce acids that attack enamel. These acids may also attack the root surface of a tooth (cementum) if it is exposed. Repeated attacks dissolve these surfaces and create holes in the tooth (cavities). If left untreated, the acids destroy the other layers of the tooth.  RISK FACTORS  Frequent sipping of sugary beverages.   Frequent snacking on sugary and starchy foods, especially those that easily get stuck in the teeth.   Poor oral hygiene.   Dry mouth.   Substance abuse such as methamphetamine abuse.   Broken or poor-fitting dental restorations.   Eating disorders.   Gastroesophageal reflux disease (GERD).   Certain radiation treatments to the head and neck. SYMPTOMS In the early stages of dental caries, symptoms are seldom present. Sometimes white, chalky areas may be seen on the enamel or other tooth layers. In later stages, symptoms may include:  Pits and holes on the enamel.  Toothache after sweet, hot, or cold foods or drinks are consumed.  Pain around the tooth.  Swelling around the tooth. DIAGNOSIS  Most of the time, dental caries is detected during a regular dental checkup. A  diagnosis is made after a thorough medical and dental history is taken and the surfaces of your teeth are checked for signs of dental caries. Sometimes special instruments, such as lasers, are used to check for dental caries. Dental X-ray exams may be taken so that areas not visible to the eye (such as between the contact areas of the teeth) can be checked for cavities.  TREATMENT  If dental caries is in its early stages, it may be reversed with a fluoride treatment or an application of a remineralizing agent at the dental office. Thorough brushing and flossing at home is needed to aid these treatments. If it is in its later stages, treatment depends on the location and extent of tooth destruction:   If a small area of the tooth has been destroyed, the destroyed area will be removed and cavities will be filled with a material such as gold, silver amalgam, or composite resin.   If a large area of the tooth has been destroyed, the destroyed area will be removed and a cap (crown) will be fitted over the remaining tooth structure.   If the center part of the tooth (pulp) is affected, a procedure called a root canal will be needed before a filling or crown can be placed.   If most of the tooth has been destroyed, the tooth may need to be pulled (extracted). HOME CARE INSTRUCTIONS You can prevent, stop, or reverse dental caries at home by practicing good oral hygiene. Good oral hygiene includes:  Thoroughly cleaning your teeth at least twice a day with  a toothbrush and dental floss.   Using a fluoride toothpaste. A fluoride mouth rinse may also be used if recommended by your dentist or health care provider.   Restricting the amount of sugary and starchy foods and sugary liquids you consume.   Avoiding frequent snacking on these foods and sipping of these liquids.   Keeping regular visits with a dentist for checkups and cleanings. PREVENTION   Practice good oral hygiene.  Consider a dental  sealant. A dental sealant is a coating material that is applied by your dentist to the pits and grooves of teeth. The sealant prevents food from being trapped in them. It may protect the teeth for several years.  Ask about fluoride supplements if you live in a community without fluorinated water or with water that has a low fluoride content. Use fluoride supplements as directed by your dentist or health care provider.  Allow fluoride varnish applications to teeth if directed by your dentist or health care provider. Document Released: 12/20/2001 Document Revised: 11/30/2012 Document Reviewed: 04/01/2012 [facility] Patient Information 2015 [place], [place]. This information is not intended to replace advice given to you by your health care provider. Make sure you discuss any questions you have with your health care provider.

## 2013-10-27 NOTE — ED Notes (Signed)
Pt c/o tooth pain on left upper side x's 1 month.  St's he is taking antibiotic and tramadol without any relief.

## 2013-10-27 NOTE — ED Provider Notes (Signed)
CSN: [id number]     Arrival date & time 10/27/13  2143 History  This chart was scribed for a non-physician practitioner, Dierdre Forth, PA-C working with Gregor Hams, MD by Julian Hy, ED Scribe. The patient was seen in TR04C/TR04C. The patient's care was started at 10:22 PM.    Chief Complaint  Patient presents with  . Dental Pain   Patient is a 53 y.o. male presenting with tooth pain. The history is provided by the patient and medical records. No language interpreter was used.  Dental Pain Location:  Upper Severity:  Moderate Duration:  1 month Timing:  Constant Progression:  Worsening Relieved by:  None tried Worsened by:  Nothing tried Ineffective treatments: tramadol. Associated symptoms: headaches   Associated symptoms: no drooling, no facial swelling, no fever and no neck pain    HPI Comments: Timothy Costa is a 53 y.o. male who presents to the Emergency Department complaining of new, constant, moderate left, upper tooth pain onset one month ago. Pt was seen at [facility] for the same symptoms and was given Amoxil and Tramadol. Pt has been compliant with medications but has experienced minimal relief. Pt states he has associated difficulty sleeping. Pt denies chills, fever, and nausea. Pt denies seeing a dentist, but [facility]  Record review shows that patient was see at [facility] on 10/24/13 for the same and given Amoxicillin and tramadol.    Past Medical History  Diagnosis Date  . Diabetes mellitus   . Hypertension    History reviewed. No pertinent past surgical history. Family History  Problem Relation Age of Onset  . Diabetes Mother   . Stroke Father   . Hypertension Father   . Diabetes Sister   . Diabetes Son    History  Substance Use Topics  . Smoking status: Former Smoker -- 15 years    Types: Cigars    Quit date: 10/10/2013  . Smokeless tobacco: Never Used  . Alcohol Use: No    Review of Systems  Constitutional:  Negative for fever, chills and appetite change.  HENT: Positive for dental problem (left upper tooth pain) and ear pain. Negative for drooling, facial swelling, nosebleeds, postnasal drip, rhinorrhea and trouble swallowing.   Eyes: Negative for pain and redness.  Respiratory: Negative for cough and wheezing.   Cardiovascular: Negative for chest pain.  Gastrointestinal: Negative for nausea, vomiting and abdominal pain.  Musculoskeletal: Negative for neck pain and neck stiffness.  Skin: Negative for color change and rash.  Neurological: Positive for headaches. Negative for weakness and light-headedness.  All other systems reviewed and are negative.     Allergies  Lisinopril and Nsaids  Home Medications   Prior to Admission medications   Medication Sig Start Date End Date Taking? Authorizing Provider  amoxicillin (AMOXIL) 500 MG capsule Take 1 capsule (500 mg total) by mouth 3 (three) times daily. 10/25/13 11/04/13  Burgess Amor, PA-C  aspirin EC 81 MG tablet Take 81 mg by mouth daily.    Historical Provider, MD  diphenhydrAMINE (BENADRYL) 25 MG tablet Take 1 tablet (25 mg total) by mouth every 6 (six) hours as needed for itching. 07/25/13   Burgess Amor, PA-C  famotidine (PEPCID) 20 MG tablet Take 1 tablet (20 mg total) by mouth 2 (two) times daily. 07/25/13   Burgess Amor, PA-C  HYDROcodone-acetaminophen (NORCO/VICODIN) 5-325 MG per tablet Take 1 tablet by mouth every 6 (six) hours as needed for moderate pain or severe pain.  10/27/13   Clio Gerhart, PA-C  insulin glargine (LANTUS) 100 UNIT/ML injection Inject into the skin at bedtime.    Historical Provider, MD  lisinopril (PRINIVIL,ZESTRIL) 10 MG tablet Take 1 tablet (10 mg total) by mouth every morning. 10/05/12   Elvina SidleKurt Lauenstein, MD  metFORMIN (GLUCOPHAGE) 500 MG tablet Take 1 tablet (500 mg total) by mouth 2 (two) times daily with a meal. 10/05/12   Elvina SidleKurt Lauenstein, MD  predniSONE (DELTASONE) 20 MG tablet Take 2 tablets daily for 4 days.  07/25/13   Burgess AmorJulie Idol, PA-C  traMADol (ULTRAM) 50 MG tablet Take 1 tablet (50 mg total) by mouth every 6 (six) hours as needed. 10/25/13   Burgess AmorJulie Idol, PA-C   Triage Vitals: BP 134/98  Pulse 68  Resp 16  Wt 178 lb (80.74 kg)  SpO2 96% Physical Exam  Nursing note and vitals reviewed. Constitutional: He appears well-developed and well-nourished.  HENT:  Head: Normocephalic.  Right Ear: Tympanic membrane, external ear and ear canal normal.  Left Ear: Tympanic membrane, external ear and ear canal normal.  Nose: Nose normal. Right sinus exhibits no maxillary sinus tenderness and no frontal sinus tenderness. Left sinus exhibits no maxillary sinus tenderness and no frontal sinus tenderness.  Mouth/Throat: Uvula is midline, oropharynx is clear and moist and mucous membranes are normal. No oral lesions. Abnormal dentition. Dental caries present. No uvula swelling or lacerations. No oropharyngeal exudate, posterior oropharyngeal edema, posterior oropharyngeal erythema or tonsillar abscesses.    Pain in Tooth #16 Dental Caries throughout No erythema or swelling to the gingiva; no gross abscess  Eyes: Conjunctivae are normal. Pupils are equal, round, and reactive to light. Right eye exhibits no discharge. Left eye exhibits no discharge.  Neck: Normal range of motion. Neck supple.  Cardiovascular: Normal rate, regular rhythm and normal heart sounds.   Pulmonary/Chest: Effort normal and breath sounds normal. No respiratory distress. He has no wheezes.  Abdominal: Soft. Bowel sounds are normal. He exhibits no distension. There is no tenderness.  Lymphadenopathy:    He has no cervical adenopathy.  Neurological: He is alert.  Skin: Skin is warm and dry.  Psychiatric: He has a normal mood and affect.    ED Course  Dental Date/Time: 10/27/2013 10:54 PM Performed by: Dierdre ForthMUTHERSBAUGH, Jadence Kinlaw Authorized by: Dierdre ForthMUTHERSBAUGH, Lonnie Rosado Consent: Verbal consent obtained. Risks and benefits: risks, benefits and  alternatives were discussed Consent given by: patient Patient understanding: patient states understanding of the procedure being performed Patient consent: the patient's understanding of the procedure matches consent given Procedure consent: procedure consent matches procedure scheduled Relevant documents: relevant documents present and verified Site marked: the operative site was marked Required items: required blood products, implants, devices, and special equipment available Patient identity confirmed: arm band and verbally with patient Time out: Immediately prior to procedure a "time out" was called to verify the correct patient, procedure, equipment, support staff and site/side marked as required. Preparation: Patient was prepped and draped in the usual sterile fashion. Local anesthesia used: yes Local anesthetic: lidocaine 2% without epinephrine Anesthetic total: 1 ml Patient sedated: no Patient tolerance: Patient tolerated the procedure well with no immediate complications. Comments: Dental block of tooth #16 without complication   (including critical care time) DIAGNOSTIC STUDIES: Oxygen Saturation is 96% on RA, normal by my interpretation.    COORDINATION OF CARE: 10:22 PM- Will perform a dental block. Patient informed of current plan for treatment and evaluation and agrees with plan at this time.    Labs Review Labs Reviewed - No  data to display  Imaging Review No results found.   EKG Interpretation None      MDM   Final diagnoses:  Pain due to dental caries   Eda Keys presents with dental pain.  No gross abscess.  Dental block given with complete relief and no complications.  Exam unconcerning for Ludwig's angina or spread of infection.  Will treat with pain medicine; patient already has antibiotic.  Urged patient to follow-up with dentist.     BP 134/98  Pulse 68  Resp 16  Wt 178 lb (80.74 kg)  SpO2 96%  I personally performed the services  described in this documentation, which was scribed in my presence. The recorded information has been reviewed and is accurate.   Dahlia Client Norissa Bartee, PA-C 10/27/13 2255

## 2013-10-28 NOTE — ED Provider Notes (Signed)
Medical screening examination/treatment/procedure(s) were performed by non-physician practitioner and as supervising physician I was immediately available for consultation/collaboration.  Forrest Jaroszewski L Maclin Guerrette, MD 10/28/13 0119 

## 2015-01-31 ENCOUNTER — Ambulatory Visit: Payer: Self-pay | Admitting: Physician Assistant

## 2015-02-11 ENCOUNTER — Ambulatory Visit: Payer: Self-pay | Admitting: Physician Assistant

## 2015-02-11 ENCOUNTER — Encounter: Payer: Self-pay | Admitting: Physician Assistant

## 2015-02-11 VITALS — BP 134/90 | HR 62 | Temp 98.2°F | Ht 74.0 in | Wt 177.5 lb

## 2015-02-11 DIAGNOSIS — IMO0001 Reserved for inherently not codable concepts without codable children: Secondary | ICD-10-CM | POA: Insufficient documentation

## 2015-02-11 DIAGNOSIS — Z13220 Encounter for screening for lipoid disorders: Secondary | ICD-10-CM

## 2015-02-11 DIAGNOSIS — E1165 Type 2 diabetes mellitus with hyperglycemia: Principal | ICD-10-CM

## 2015-02-11 DIAGNOSIS — Z1389 Encounter for screening for other disorder: Secondary | ICD-10-CM

## 2015-02-11 DIAGNOSIS — Z125 Encounter for screening for malignant neoplasm of prostate: Secondary | ICD-10-CM

## 2015-02-11 NOTE — Progress Notes (Signed)
BP 134/90 mmHg  Pulse 62  Temp(Src) 98.2 F (36.8 C)  Ht 6\' 2"  (1.88 m)  Wt 177 lb 8 oz (80.513 kg)  BMI 22.78 kg/m2  SpO2 99%   Subjective:    Patient ID: Timothy Costa, male    DOB: 04/15/1960, 54 y.o.   MRN: [id number]  HPI: Timothy Costa is a 54 y.o. male presenting on 02/11/2015 for Diabetes and Memory Loss   HPI  -Pt is here today for f/u of diabetes but he wants to talk about his memory -Pt is doing fine with insulin.  He had to stop the [place] and glipizide b/c he says they both caused nausea and dizziness. -reviewed bs log  -Pt feels like he has problems with his memory for 4-5 years.    Relevant past medical, surgical, family and social history reviewed and updated as indicated. Interim medical history since our last visit reviewed. Allergies and medications reviewed and updated.  Current outpatient prescriptions:  .  insulin glargine (LANTUS) 100 UNIT/ML injection, Inject 5 Units into the skin at bedtime. , Disp: , Rfl:  .  metFORMIN (GLUCOPHAGE) 500 MG tablet, Take 1 tablet (500 mg total) by mouth 2 (two) times daily with a meal. (Patient taking differently: Take 1,000 mg by mouth 2 (two) times daily with a meal. ), Disp: 60 tablet, Rfl: 11   Review of Systems  Constitutional: Positive for chills, diaphoresis, appetite change, fatigue and unexpected weight change. Negative for fever.  HENT: Positive for dental problem, drooling, mouth sores and sneezing. Negative for congestion, ear pain, facial swelling, hearing loss, sore throat, trouble swallowing and voice change.   Eyes: Positive for itching. Negative for pain, discharge, redness and visual disturbance.  Respiratory: Negative for cough, choking, shortness of breath and wheezing.   Cardiovascular: Negative for chest pain, palpitations and leg swelling.  Gastrointestinal: Positive for diarrhea. Negative for vomiting, abdominal pain, constipation and blood in stool.  Endocrine: Positive for polydipsia.  Negative for cold intolerance and heat intolerance.  Genitourinary: Negative for dysuria, hematuria and decreased urine volume.  Musculoskeletal: Positive for arthralgias and gait problem. Negative for back pain.  Skin: Positive for rash.  Allergic/Immunologic: Negative for environmental allergies.  Neurological: Negative for seizures, syncope, light-headedness and headaches.  Hematological: Negative for adenopathy.  Psychiatric/Behavioral: Positive for dysphoric mood. Negative for suicidal ideas and agitation. The patient is nervous/anxious.     Per HPI unless specifically indicated above     Objective:    BP 134/90 mmHg  Pulse 62  Temp(Src) 98.2 F (36.8 C)  Ht 6\' 2"  (1.88 m)  Wt 177 lb 8 oz (80.513 kg)  BMI 22.78 kg/m2  SpO2 99%  Wt Readings from Last 3 Encounters:  02/11/15 177 lb 8 oz (80.513 kg)  10/27/13 178 lb (80.74 kg)  10/24/13 160 lb (72.576 kg)    Physical Exam  Constitutional: He is oriented to person, place, and time. He appears well-developed and well-nourished.  HENT:  Head: Normocephalic and atraumatic.  Neck: Neck supple.  Cardiovascular: Normal rate and regular rhythm.   Pulmonary/Chest: Effort normal and breath sounds normal. He has no wheezes.  Abdominal: Soft. Bowel sounds are normal. There is no tenderness.  Musculoskeletal: He exhibits no edema.  Lymphadenopathy:    He has no cervical adenopathy.  Neurological: He is alert and oriented to person, place, and time.  Skin: Skin is warm and dry.  Psychiatric: He has a normal mood and affect. His behavior is normal.  Vitals reviewed.  Mini Mental Status Exam (MMSE) 22/23 (pt doesn't read/write)     Assessment & Plan:   Encounter Diagnoses  Name Primary?  Marland Kitchen Uncontrolled diabetes mellitus type 2 without complications, unspecified long term insulin use status (HCC) Yes  . Screening cholesterol level   . Screening for prostate cancer   . Encounter for screening for other disorder      -Get  fasting labs drawn this week. Will call with results. -discussed results normal of screening MMSE -F/u OV 3 mo. rto sooner prn

## 2015-02-14 LAB — PSA: PSA: 0.78 ng/mL (ref <=4.00)

## 2015-02-14 LAB — LIPID PANEL
Cholesterol: 177 mg/dL (ref 125–200)
HDL: 54 mg/dL (ref >=40)
LDL CALC: 104 mg/dL (ref <130)
Total CHOL/HDL Ratio: 3.3 Ratio (ref <=5.0)
Triglycerides: 93 mg/dL (ref <150)
VLDL: 19 mg/dL (ref <30)

## 2015-02-14 LAB — MICROALBUMIN, URINE: Microalb, Ur: 0.6 mg/dL

## 2015-02-14 LAB — HEMOGLOBIN A1C
Hgb A1c MFr Bld: 6.7 % — ABNORMAL HIGH (ref <5.7)
Mean Plasma Glucose: 146 mg/dL — ABNORMAL HIGH (ref <117)

## 2015-05-14 ENCOUNTER — Encounter: Payer: Self-pay | Admitting: Physician Assistant

## 2015-05-14 ENCOUNTER — Ambulatory Visit: Payer: Self-pay | Admitting: Physician Assistant

## 2015-05-14 VITALS — BP 150/90 | HR 67 | Temp 97.9°F | Ht 69.0 in | Wt 182.2 lb

## 2015-05-14 DIAGNOSIS — I10 Essential (primary) hypertension: Secondary | ICD-10-CM

## 2015-05-14 DIAGNOSIS — E785 Hyperlipidemia, unspecified: Secondary | ICD-10-CM

## 2015-05-14 DIAGNOSIS — E119 Type 2 diabetes mellitus without complications: Secondary | ICD-10-CM

## 2015-05-14 MED ORDER — LOSARTAN POTASSIUM 50 MG PO TABS: 50.0000 mg | ORAL_TABLET | Freq: Every day | ORAL | Status: DC

## 2015-05-14 MED ORDER — SILDENAFIL CITRATE 100 MG PO TABS: 50.0000 mg | ORAL_TABLET | Freq: Every day | ORAL | Status: DC | PRN

## 2015-05-14 MED ORDER — ATORVASTATIN CALCIUM 10 MG PO TABS: 10.0000 mg | ORAL_TABLET | Freq: Every day | ORAL | Status: DC

## 2015-05-14 NOTE — Progress Notes (Signed)
BP 150/90 mmHg  Pulse 67  Temp(Src) 97.9 F (36.6 C)  Ht  (1.753 m)  Wt 182 lb 3.2 oz (82.645 kg)  BMI 26.89 kg/m2  SpO2 99%   Subjective:    Patient ID: Timothy Costa, male    DOB: [birth year], 55 y.o.   MRN: [id number]  HPI: Timothy Costa is a 55 y.o. male presenting on 05/14/2015 for Diabetes   HPI Pt is doing well today.  Relevant past medical, surgical, family and social history reviewed and updated as indicated. Interim medical history since our last visit reviewed. Allergies and medications reviewed and updated.  Current outpatient prescriptions:  .  insulin glargine (LANTUS) 100 UNIT/ML injection, Inject 8 Units into the skin at bedtime. , Disp: , Rfl:  .  metFORMIN (GLUCOPHAGE) 500 MG tablet, Take 1 tablet (500 mg total) by mouth 2 (two) times daily with a meal., Disp: 60 tablet, Rfl: 11  Review of Systems  Constitutional: Negative for fever, chills, diaphoresis, appetite change, fatigue and unexpected weight change.  HENT: Negative for congestion, dental problem, drooling, ear pain, facial swelling, hearing loss, mouth sores, sneezing, sore throat, trouble swallowing and voice change.   Eyes: Negative for pain, discharge, redness, itching and visual disturbance.  Respiratory: Negative for cough, choking, shortness of breath and wheezing.   Cardiovascular: Negative for chest pain, palpitations and leg swelling.  Gastrointestinal: Negative for vomiting, abdominal pain, diarrhea, constipation and blood in stool.  Endocrine: Negative for cold intolerance, heat intolerance and polydipsia.  Genitourinary: Negative for dysuria, hematuria and decreased urine volume.  Musculoskeletal: Positive for arthralgias. Negative for back pain and gait problem.  Skin: Negative for rash.  Allergic/Immunologic: Negative for environmental allergies.  Neurological: Negative for seizures, syncope, light-headedness and headaches.  Hematological: Negative for adenopathy.   Psychiatric/Behavioral: Positive for dysphoric mood. Negative for suicidal ideas and agitation. The patient is not nervous/anxious.        Per HPI unless specifically indicated above     Objective:    BP 150/90 mmHg  Pulse 67  Temp(Src) 97.9 F (36.6 C)  Ht  (1.753 m)  Wt 182 lb 3.2 oz (82.645 kg)  BMI 26.89 kg/m2  SpO2 99%  Wt Readings from Last 3 Encounters:  05/14/15 182 lb 3.2 oz (82.645 kg)  02/11/15 177 lb 8 oz (80.513 kg)  10/27/13 178 lb (80.74 kg)    Physical Exam  Constitutional: He is oriented to person, place, and time. He appears well-developed and well-nourished.  HENT:  Head: Normocephalic and atraumatic.  Neck: Neck supple.  Cardiovascular: Normal rate and regular rhythm.   Pulmonary/Chest: Effort normal and breath sounds normal. He has no wheezes.  Abdominal: Soft. Bowel sounds are normal. There is no hepatosplenomegaly. There is no tenderness.  Musculoskeletal: He exhibits no edema.  Lymphadenopathy:    He has no cervical adenopathy.  Neurological: He is alert and oriented to person, place, and time.  Skin: Skin is warm and dry.  Psychiatric: He has a normal mood and affect. His behavior is normal.  Vitals reviewed.    Foot exam done      Assessment & Plan:    Encounter Diagnoses  Name Primary?  . Type 2 diabetes mellitus without complication, unspecified long term insulin use status (HCC) Yes  . Essential hypertension, benign   . Hyperlipidemia      -refer for annual Dm eye exam -put on Dental list -Sign up for [facility]- rx  Atorvastatin, losartan, viagra -F/u 1 mo to  recheck bp

## 2015-05-14 NOTE — Patient Instructions (Addendum)
Continue your metformin and lantus.  Medicines coming in the mail through [facility]:  Atorvastatin for cholesterol Losartan for blood pressure viagra for erectile dysfunction   Sildenafil tablets (Viagra) What is this medicine? SILDENAFIL (sil DEN a fil) is used to treat erection problems in men. This medicine may be used for other purposes; ask your health care provider or pharmacist if you have questions. What should I tell my health care provider before I take this medicine? They need to know if you have any of these conditions: -bleeding disorders -eye or vision problems, including a rare inherited eye disease called retinitis pigmentosa -anatomical deformation of the penis, Peyronie's disease, or history of priapism (painful and prolonged erection) -heart disease, angina, a history of heart attack, irregular heart beats, or other heart problems -high or low blood pressure -history of blood diseases, like sickle cell anemia or leukemia -history of stomach bleeding -kidney disease -liver disease -stroke -an unusual or allergic reaction to sildenafil, other medicines, foods, dyes, or preservatives -pregnant or trying to get pregnant -breast-feeding How should I use this medicine? Take this medicine by mouth with a glass of water. Follow the directions on the prescription label. The dose is usually taken 1 hour before sexual activity. You should not take the dose more than once per day. Do not take your medicine more often than directed. Talk to your pediatrician regarding the use of this medicine in children. This medicine is not used in children for this condition. Overdosage: If you think you have taken too much of this medicine contact a poison control center or emergency room at once. NOTE: This medicine is only for you. Do not share this medicine with others. What if I miss a dose? This does not apply. Do not take double or extra doses. What may interact with this  medicine? Do not take this medicine with any of the following medications: -cisapride -methscopolamine nitrate -nitrates like amyl nitrite, isosorbide dinitrate, isosorbide mononitrate, nitroglycerin -nitroprusside -other medicines for erectile dysfunction like avanafil, tadalafil, vardenafil -riociguat -other sildenafil products (Revatio) This medicine may also interact with the following medications: -certain drugs for high blood pressure -certain drugs for the treatment of HIV infection or AIDS -certain drugs used for fungal or yeast infections, like fluconazole, itraconazole, ketoconazole, and voriconazole -cimetidine -erythromycin -rifampin This list may not describe all possible interactions. Give your health care provider a list of all the medicines, herbs, non-prescription drugs, or dietary supplements you use. Also tell them if you smoke, drink alcohol, or use illegal drugs. Some items may interact with your medicine. What should I watch for while using this medicine? If you notice any changes in your vision while taking this drug, call your doctor or health care professional as soon as possible. Stop using this medicine and call your health care provider right away if you have a loss of sight in one or both eyes. Contact your doctor or health care professional right away if you have an erection that lasts longer than 4 hours or if it becomes painful. This may be a sign of a serious problem and must be treated right away to prevent permanent damage. If you experience symptoms of nausea, dizziness, chest pain or arm pain upon initiation of sexual activity after taking this medicine, you should refrain from further activity and call your doctor or health care professional as soon as possible. Do not drink alcohol to excess (examples, 5 glasses of wine or 5 shots of whiskey) when taking this medicine. When  taken in excess, alcohol can increase your chances of getting a headache or getting  dizzy, increasing your heart rate or lowering your blood pressure. Using this medicine does not protect you or your partner against HIV infection (the virus that causes AIDS) or other sexually transmitted diseases. What side effects may I notice from receiving this medicine? Side effects that you should report to your doctor or health care professional as soon as possible: -allergic reactions like skin rash, itching or hives, swelling of the face, lips, or tongue -breathing problems -changes in hearing -changes in vision -chest pain -fast, irregular heartbeat -prolonged or painful erection -seizures Side effects that usually do not require medical attention (report to your doctor or health care professional if they continue or are bothersome): -back pain -dizziness -flushing -headache -indigestion -muscle aches -nausea -stuffy or runny nose This list may not describe all possible side effects. Call your doctor for medical advice about side effects. You may report side effects to FDA at 1-800-FDA-1088. Where should I keep my medicine? Keep out of reach of children. Store at room temperature between 15 and 30 degrees C (59 and 86 degrees F). Throw away any unused medicine after the expiration date. NOTE: This sheet is a summary. It may not cover all possible information. If you have questions about this medicine, talk to your doctor, pharmacist, or health care provider.    2016, [employer]vier/Gold Standard. (2013-08-18 13:19:04)

## 2015-06-10 ENCOUNTER — Other Ambulatory Visit: Payer: Self-pay | Admitting: Physician Assistant

## 2015-06-10 MED ORDER — METFORMIN HCL 1000 MG PO TABS: 1000.0000 mg | ORAL_TABLET | Freq: Two times a day (BID) | ORAL | Status: DC

## 2015-06-11 ENCOUNTER — Ambulatory Visit: Payer: Self-pay | Admitting: Physician Assistant

## 2015-06-11 ENCOUNTER — Encounter: Payer: Self-pay | Admitting: Physician Assistant

## 2015-06-11 VITALS — BP 130/94 | HR 61 | Temp 98.1°F | Ht 69.0 in | Wt 178.6 lb

## 2015-06-11 DIAGNOSIS — I10 Essential (primary) hypertension: Secondary | ICD-10-CM

## 2015-06-11 DIAGNOSIS — E785 Hyperlipidemia, unspecified: Secondary | ICD-10-CM

## 2015-06-11 DIAGNOSIS — E1165 Type 2 diabetes mellitus with hyperglycemia: Secondary | ICD-10-CM

## 2015-06-11 DIAGNOSIS — IMO0001 Reserved for inherently not codable concepts without codable children: Secondary | ICD-10-CM

## 2015-06-11 NOTE — Progress Notes (Signed)
   BP 130/94 mmHg  Pulse 61  Temp(Src) 98.1 F (36.7 C)  Ht  (1.753 m)  Wt 178 lb 9.6 oz (81.012 kg)  BMI 26.36 kg/m2  SpO2 98%   Subjective:    Patient ID: Timothy Costa, male    DOB: 1960-07-21, 55 y.o.   MRN: [id number]  HPI: Timothy Costa is a 55 y.o. male presenting on 06/11/2015 for Hypertension   HPI Pt doing well- no complaints today  Relevant past medical, surgical, family and social history reviewed and updated as indicated. Interim medical history since our last visit reviewed. Allergies and medications reviewed and updated.  Review of Systems  Constitutional: Negative for fever, chills, diaphoresis, appetite change, fatigue and unexpected weight change.  HENT: Negative for congestion, dental problem, drooling, ear pain, facial swelling, hearing loss, mouth sores, sneezing, sore throat, trouble swallowing and voice change.   Eyes: Negative for pain, discharge, redness, itching and visual disturbance.  Respiratory: Negative for cough, choking, shortness of breath and wheezing.   Cardiovascular: Negative for chest pain, palpitations and leg swelling.  Gastrointestinal: Negative for vomiting, abdominal pain, diarrhea, constipation and blood in stool.  Endocrine: Negative for cold intolerance, heat intolerance and polydipsia.  Genitourinary: Negative for dysuria, hematuria and decreased urine volume.  Musculoskeletal: Negative for back pain, arthralgias and gait problem.  Skin: Negative for rash.  Allergic/Immunologic: Negative for environmental allergies.  Neurological: Negative for seizures, syncope, light-headedness and headaches.  Hematological: Negative for adenopathy.  Psychiatric/Behavioral: Negative for suicidal ideas, dysphoric mood and agitation. The patient is not nervous/anxious.     Per HPI unless specifically indicated above     Objective:    BP 130/94 mmHg  Pulse 61  Temp(Src) 98.1 F (36.7 C)  Ht  (1.753 m)  Wt 178 lb 9.6 oz (81.012  kg)  BMI 26.36 kg/m2  SpO2 98%  Wt Readings from Last 3 Encounters:  06/11/15 178 lb 9.6 oz (81.012 kg)  05/14/15 182 lb 3.2 oz (82.645 kg)  02/11/15 177 lb 8 oz (80.513 kg)    Physical Exam  Constitutional: He is oriented to person, place, and time. He appears well-developed and well-nourished.  HENT:  Head: Normocephalic and atraumatic.  Neck: Neck supple.  Cardiovascular: Normal rate and regular rhythm.   Pulmonary/Chest: Effort normal and breath sounds normal. He has no wheezes.  Abdominal: Soft. Bowel sounds are normal. There is no hepatosplenomegaly. There is no tenderness.  Musculoskeletal: He exhibits no edema.  Lymphadenopathy:    He has no cervical adenopathy.  Neurological: He is alert and oriented to person, place, and time.  Skin: Skin is warm and dry.  Psychiatric: He has a normal mood and affect. His behavior is normal.  Vitals reviewed.       Assessment & Plan:   Encounter Diagnoses  Name Primary?  . Essential hypertension, benign Yes  . Uncontrolled diabetes mellitus type 2 without complications, unspecified long term insulin use status (HCC)   . Hyperlipidemia      -bp improved.  Continue current -F/u 2 months- check labs before appt

## 2015-08-06 ENCOUNTER — Other Ambulatory Visit: Payer: Self-pay

## 2015-08-06 DIAGNOSIS — I10 Essential (primary) hypertension: Secondary | ICD-10-CM

## 2015-08-06 DIAGNOSIS — E1165 Type 2 diabetes mellitus with hyperglycemia: Secondary | ICD-10-CM

## 2015-08-06 DIAGNOSIS — E785 Hyperlipidemia, unspecified: Secondary | ICD-10-CM

## 2015-08-06 DIAGNOSIS — IMO0001 Reserved for inherently not codable concepts without codable children: Secondary | ICD-10-CM

## 2015-08-08 LAB — LIPID PANEL
CHOLESTEROL: 143 mg/dL (ref 125–200)
HDL: 53 mg/dL (ref >=40)
LDL CALC: 68 mg/dL (ref <130)
TRIGLYCERIDES: 109 mg/dL (ref <150)
Total CHOL/HDL Ratio: 2.7 Ratio (ref <=5.0)
VLDL: 22 mg/dL (ref <30)

## 2015-08-08 LAB — COMPLETE METABOLIC PANEL WITH GFR
ALT: 17 U/L (ref 9–46)
AST: 21 U/L (ref 10–35)
Albumin: 4.1 g/dL (ref 3.6–5.1)
Alkaline Phosphatase: 50 U/L (ref 40–115)
BUN: 15 mg/dL (ref 7–25)
CALCIUM: 9.2 mg/dL (ref 8.6–10.3)
CHLORIDE: 103 mmol/L (ref 98–110)
CO2: 26 mmol/L (ref 20–31)
Creat: 1.18 mg/dL (ref 0.70–1.33)
GFR, Est African American: 81 mL/min (ref >=60)
GFR, Est Non African American: 70 mL/min (ref >=60)
Glucose, Bld: 224 mg/dL — ABNORMAL HIGH (ref 65–99)
Potassium: 4.3 mmol/L (ref 3.5–5.3)
Sodium: 137 mmol/L (ref 135–146)
Total Bilirubin: 0.5 mg/dL (ref 0.2–1.2)
Total Protein: 6.4 g/dL (ref 6.1–8.1)

## 2015-08-08 LAB — HEMOGLOBIN A1C
HEMOGLOBIN A1C: 9.8 % — AB (ref <5.7)
Mean Plasma Glucose: 235 mg/dL

## 2015-08-12 ENCOUNTER — Ambulatory Visit: Payer: Self-pay | Admitting: Physician Assistant

## 2015-08-12 VITALS — BP 120/76 | HR 71 | Temp 98.1°F | Ht 69.0 in | Wt 181.4 lb

## 2015-08-12 DIAGNOSIS — E785 Hyperlipidemia, unspecified: Secondary | ICD-10-CM

## 2015-08-12 DIAGNOSIS — IMO0001 Reserved for inherently not codable concepts without codable children: Secondary | ICD-10-CM

## 2015-08-12 DIAGNOSIS — I10 Essential (primary) hypertension: Secondary | ICD-10-CM

## 2015-08-12 DIAGNOSIS — N529 Male erectile dysfunction, unspecified: Secondary | ICD-10-CM | POA: Insufficient documentation

## 2015-08-12 DIAGNOSIS — E1165 Type 2 diabetes mellitus with hyperglycemia: Principal | ICD-10-CM

## 2015-08-12 NOTE — Progress Notes (Signed)
BP 120/76 mmHg  Pulse 71  Temp(Src) 98.1 F (36.7 C)  Ht  (1.753 m)  Wt 181 lb 6.4 oz (82.283 kg)  BMI 26.78 kg/m2  SpO2 96%   Subjective:    Patient ID: Timothy Costa, male    DOB: Dec 05, 1960, 55 y.o.   MRN: [id number]  HPI: Timothy Costa is a 55 y.o. male presenting on 08/12/2015 for Diabetes; Hypertension; and Hyperlipidemia   HPI  Pt has insurance now  Pt feels well.  He admits that he has not been following a diabetic diet.  In particular, he says he has a hard time not stopping in at the [facility].  Relevant past medical, surgical, family and social history reviewed and updated as indicated. Interim medical history since our last visit reviewed. Allergies and medications reviewed and updated.  Current outpatient prescriptions:  .  atorvastatin (LIPITOR) 10 MG tablet, Take 1 tablet (10 mg total) by mouth at bedtime., Disp: 90 tablet, Rfl: 1 .  insulin glargine (LANTUS) 100 UNIT/ML injection, Inject 15 Units into the skin at bedtime. , Disp: , Rfl:  .  losartan (COZAAR) 50 MG tablet, Take 1 tablet (50 mg total) by mouth daily., Disp: 90 tablet, Rfl: 1 .  metFORMIN (GLUCOPHAGE) 1000 MG tablet, Take 1 tablet (1,000 mg total) by mouth 2 (two) times daily with a meal., Disp: 60 tablet, Rfl: 2 .  sildenafil (VIAGRA) 100 MG tablet, Take 0.5-1 tablets (50-100 mg total) by mouth daily as needed for erectile dysfunction., Disp: 10 tablet, Rfl: 0   Review of Systems  Constitutional: Negative for fever, chills, diaphoresis, appetite change, fatigue and unexpected weight change.  HENT: Negative for congestion, dental problem, drooling, ear pain, facial swelling, hearing loss, mouth sores, sneezing, sore throat, trouble swallowing and voice change.   Eyes: Positive for itching. Negative for pain, discharge, redness and visual disturbance.  Respiratory: Negative for cough, choking, shortness of breath and wheezing.   Cardiovascular: Negative for chest pain, palpitations and  leg swelling.  Gastrointestinal: Negative for vomiting, abdominal pain, diarrhea, constipation and blood in stool.  Endocrine: Negative for cold intolerance, heat intolerance and polydipsia.  Genitourinary: Negative for dysuria, hematuria and decreased urine volume.  Musculoskeletal: Negative for back pain, arthralgias and gait problem.  Skin: Negative for rash.  Allergic/Immunologic: Negative for environmental allergies.  Neurological: Negative for seizures, syncope, light-headedness and headaches.  Hematological: Negative for adenopathy.  Psychiatric/Behavioral: Negative for suicidal ideas, dysphoric mood and agitation. The patient is not nervous/anxious.     Per HPI unless specifically indicated above     Objective:    BP 120/76 mmHg  Pulse 71  Temp(Src) 98.1 F (36.7 C)  Ht  (1.753 m)  Wt 181 lb 6.4 oz (82.283 kg)  BMI 26.78 kg/m2  SpO2 96%  Wt Readings from Last 3 Encounters:  08/12/15 181 lb 6.4 oz (82.283 kg)  06/11/15 178 lb 9.6 oz (81.012 kg)  05/14/15 182 lb 3.2 oz (82.645 kg)    Physical Exam  Constitutional: He is oriented to person, place, and time. He appears well-developed and well-nourished.  HENT:  Head: Normocephalic and atraumatic.  Neck: Neck supple.  Cardiovascular: Normal rate and regular rhythm.   Pulmonary/Chest: Effort normal and breath sounds normal. He has no wheezes.  Abdominal: Soft. Bowel sounds are normal. There is no hepatosplenomegaly. There is no tenderness.  Musculoskeletal: He exhibits no edema.  Lymphadenopathy:    He has no cervical adenopathy.  Neurological: He is alert and oriented  to person, place, and time.  Skin: Skin is warm and dry.  Psychiatric: He has a normal mood and affect. His behavior is normal.  Vitals reviewed. diabetic foot exam done  Results for orders placed or performed in visit on 08/06/15  COMPLETE METABOLIC PANEL WITH GFR  Result Value Ref Range   Sodium 137 135 - 146 mmol/L   Potassium 4.3 3.5 - 5.3  mmol/L   Chloride 103 98 - 110 mmol/L   CO2 26 20 - 31 mmol/L   Glucose, Bld 224 (H) 65 - 99 mg/dL   BUN 15 7 - 25 mg/dL   Creat 1.191.18 1.470.70 - 8.291.33 mg/dL   Total Bilirubin 0.5 0.2 - 1.2 mg/dL   Alkaline Phosphatase 50 40 - 115 U/L   AST 21 10 - 35 U/L   ALT 17 9 - 46 U/L   Total Protein 6.4 6.1 - 8.1 g/dL   Albumin 4.1 3.6 - 5.1 g/dL   Calcium 9.2 8.6 - 56.210.3 mg/dL   GFR, Est African American 81 >=60 mL/min   GFR, Est Non African American 70 >=60 mL/min  HgB A1c  Result Value Ref Range   Hgb A1c MFr Bld 9.8 (H) <5.7 %   Mean Plasma Glucose 235 mg/dL  Lipid Profile  Result Value Ref Range   Cholesterol 143 125 - 200 mg/dL   Triglycerides 130109 <865<150 mg/dL   HDL 53 >=78>=40 mg/dL   Total CHOL/HDL Ratio 2.7 <=5.0 Ratio   VLDL 22 <30 mg/dL   LDL Cholesterol 68 <469<130 mg/dL      Assessment & Plan:   Encounter Diagnoses  Name Primary?  Marland Kitchen. Uncontrolled diabetes mellitus type 2 without complications, unspecified long term insulin use status (HCC) Yes  . Hyperlipidemia   . Essential hypertension, benign   . Erectile dysfunction, unspecified erectile dysfunction type     -counseled pt to get back on diabetic diet!  No medication changes today- a1c was 6.7 in October when pt was eating healthfully -Pt was on list for diabetic eye exam, but now that he has insurance, he can call and get appointment for diabetic eye exam -discussed with pt that he needs to get new PCP.  Will need f/u in 3 months to recheck a1c.  A new provider may want to see him sooner than that as well

## 2015-08-19 ENCOUNTER — Other Ambulatory Visit: Payer: Self-pay | Admitting: Physician Assistant

## 2015-08-19 MED ORDER — METFORMIN HCL 1000 MG PO TABS: 1000.0000 mg | ORAL_TABLET | Freq: Two times a day (BID) | ORAL | Status: DC

## 2015-10-14 ENCOUNTER — Other Ambulatory Visit: Payer: Self-pay | Admitting: Physician Assistant

## 2015-10-22 ENCOUNTER — Other Ambulatory Visit: Payer: Self-pay | Admitting: Physician Assistant

## 2015-11-11 ENCOUNTER — Other Ambulatory Visit: Payer: Self-pay | Admitting: Physician Assistant

## 2015-12-06 ENCOUNTER — Other Ambulatory Visit: Payer: Self-pay | Admitting: Physician Assistant

## 2015-12-10 ENCOUNTER — Ambulatory Visit (INDEPENDENT_AMBULATORY_CARE_PROVIDER_SITE_OTHER): Payer: Medicare HMO | Admitting: Physician Assistant

## 2015-12-10 VITALS — BP 122/90 | HR 89 | Temp 98.4°F | Resp 17 | Ht 69.0 in | Wt 186.0 lb

## 2015-12-10 DIAGNOSIS — E785 Hyperlipidemia, unspecified: Secondary | ICD-10-CM

## 2015-12-10 DIAGNOSIS — I10 Essential (primary) hypertension: Secondary | ICD-10-CM | POA: Diagnosis not present

## 2015-12-10 DIAGNOSIS — E119 Type 2 diabetes mellitus without complications: Secondary | ICD-10-CM

## 2015-12-10 DIAGNOSIS — Z23 Encounter for immunization: Secondary | ICD-10-CM

## 2015-12-10 DIAGNOSIS — N529 Male erectile dysfunction, unspecified: Secondary | ICD-10-CM | POA: Diagnosis not present

## 2015-12-10 DIAGNOSIS — Z794 Long term (current) use of insulin: Secondary | ICD-10-CM | POA: Diagnosis not present

## 2015-12-10 MED ORDER — SILDENAFIL CITRATE 100 MG PO TABS: 50.0000 mg | ORAL_TABLET | Freq: Every day | ORAL | 5 refills | Status: DC | PRN

## 2015-12-10 MED ORDER — GLUCOSE BLOOD VI STRP: ORAL_STRIP | 12 refills | Status: AC

## 2015-12-10 MED ORDER — METFORMIN HCL 1000 MG PO TABS: 1000.0000 mg | ORAL_TABLET | Freq: Two times a day (BID) | ORAL | 1 refills | Status: DC

## 2015-12-10 MED ORDER — ATORVASTATIN CALCIUM 10 MG PO TABS: 10.0000 mg | ORAL_TABLET | Freq: Every day | ORAL | 1 refills | Status: DC

## 2015-12-10 MED ORDER — LOSARTAN POTASSIUM 50 MG PO TABS: 50.0000 mg | ORAL_TABLET | Freq: Every day | ORAL | 1 refills | Status: DC

## 2015-12-10 NOTE — Progress Notes (Signed)
Patient ID: Timothy Costa, male    DOB: 04/27/1960, 55 y.o.   MRN: [id number]  PCP: Leo GrosserPICKARD,WARREN TOM, MD  Subjective:   Chief Complaint  Patient presents with  . Medication Refill    metformin and viagra    HPI Presents for medication refills.  He was previously followed here by my colleagues, Dr. Jenness CornerLaunestein, who has retired, and Benny LennertSarah WEber, PA-C, who is not here presently. He was followed in the free clinic for several years due to lack of insurance. He was then [facility], but he prefers to return here.  Glucose running 400-500's. Was testing BID until he ran out of test strips last night. Ran out of metformin 3-4 months ago. Could no longer get refills from the free clinic since he had insurance, and was told that he needed to establish with another provider.  A1C 6.7% 02/11/2015, rose to 9.8% 08/06/2015  Review of Systems  Constitutional: Negative for activity change, appetite change, fatigue and unexpected weight change.  HENT: Negative for congestion, dental problem, ear pain, hearing loss, mouth sores, postnasal drip, rhinorrhea, sneezing, sore throat, tinnitus and trouble swallowing.   Eyes: Negative for photophobia, pain, redness and visual disturbance.  Respiratory: Negative for cough, chest tightness and shortness of breath.   Cardiovascular: Negative for chest pain, palpitations and leg swelling.  Gastrointestinal: Negative for abdominal pain, blood in stool, constipation, diarrhea, nausea and vomiting.  Genitourinary: Negative for dysuria, frequency, hematuria and urgency.  Musculoskeletal: Negative for arthralgias, gait problem, myalgias and neck stiffness.  Skin: Negative for rash.  Neurological: Negative for dizziness, speech difficulty, weakness, light-headedness, numbness and headaches.  Hematological: Negative for adenopathy.  Psychiatric/Behavioral: Negative for confusion and sleep disturbance. The patient is not  nervous/anxious.        Patient Active Problem List   Diagnosis Date Noted  . Erectile dysfunction 08/12/2015  . Type 2 diabetes mellitus without complication (HCC) 05/14/2015  . Essential hypertension, benign 05/14/2015  . Hyperlipidemia 05/14/2015  . Uncontrolled diabetes mellitus type 2 without complications (HCC) 02/11/2015     Prior to Admission medications   Medication Sig Start Date End Date Taking? Authorizing Provider  atorvastatin (LIPITOR) 10 MG tablet Take 1 tablet (10 mg total) by mouth at bedtime. 05/14/15  Yes Jacquelin HawkingShannon McElroy, PA-C  insulin glargine (LANTUS) 100 UNIT/ML injection Inject 15 Units into the skin at bedtime.    Yes Historical Provider, MD  losartan (COZAAR) 50 MG tablet Take 1 tablet (50 mg total) by mouth daily. 05/14/15  Yes Jacquelin HawkingShannon McElroy, PA-C  metFORMIN (GLUCOPHAGE) 1000 MG tablet Take 1 tablet (1,000 mg total) by mouth 2 (two) times daily with a meal. 08/19/15  no Jacquelin HawkingShannon McElroy, PA-C  sildenafil (VIAGRA) 100 MG tablet Take 0.5-1 tablets (50-100 mg total) by mouth daily as needed for erectile dysfunction. 05/14/15  no Jacquelin HawkingShannon McElroy, PA-C     Allergies  Allergen Reactions  . Lisinopril Hives and Swelling  . Nsaids Swelling and Other (See Comments)    Gi upset       Objective:  Physical Exam  Constitutional: He is oriented to person, place, and time. He appears well-developed and well-nourished. He is active and cooperative. No distress.  BP 122/90 (BP Location: Right Arm, Patient Position: Sitting, Cuff Size: Large)   Pulse 89   Temp 98.4 F (36.9 C) (Oral)   Resp 17   Ht 5\' 9"  (1.753 m)   Wt 186 lb (84.4 kg)   SpO2 98%  BMI 27.47 kg/m   HENT:  Head: Normocephalic and atraumatic.  Right Ear: Hearing normal.  Left Ear: Hearing normal.  Eyes: Conjunctivae are normal. No scleral icterus.  Neck: Normal range of motion. Neck supple. No thyromegaly present.  Cardiovascular: Normal rate, regular rhythm and normal heart sounds.    Pulses:      Radial pulses are 2+ on the right side, and 2+ on the left side.  Pulmonary/Chest: Effort normal and breath sounds normal.  Lymphadenopathy:       Head (right side): No tonsillar, no preauricular, no posterior auricular and no occipital adenopathy present.       Head (left side): No tonsillar, no preauricular, no posterior auricular and no occipital adenopathy present.    He has no cervical adenopathy.       Right: No supraclavicular adenopathy present.       Left: No supraclavicular adenopathy present.  Neurological: He is alert and oriented to person, place, and time. No sensory deficit.  Skin: Skin is warm, dry and intact. No rash noted. No cyanosis or erythema. Nails show no clubbing.  Psychiatric: He has a normal mood and affect. His speech is normal and behavior is normal.           Assessment & Plan:   1. Type 2 diabetes mellitus without complication, with long-term current use of insulin (HCC) Uncontrolled. Elected against A1C today, knowing it will be high, likely >10, given that it was nearly so when he was taking metformin. Resume metformin, but for the first week advised 500 mg BID to reduce GI side effects. Continue to check glucose and RTC in 6 weeks, sooner if needed. - glucose blood test strip; Use to check home blood sugar twice daily  Dispense: 100 each; Refill: 12 - metFORMIN (GLUCOPHAGE) 1000 MG tablet; Take 1 tablet (1,000 mg total) by mouth 2 (two) times daily with a meal.  Dispense: 180 tablet; Refill: 1  2. Essential hypertension, benign Barely-controlled. Stable. Continue current treatment. Depending on labs at next visit, would consider increasing dose or addition of HCTZ. - losartan (COZAAR) 50 MG tablet; Take 1 tablet (50 mg total) by mouth daily.  Dispense: 90 tablet; Refill: 1  3. Hyperlipidemia - atorvastatin (LIPITOR) 10 MG tablet; Take 1 tablet (10 mg total) by mouth at bedtime.  Dispense: 90 tablet; Refill: 1  4. Erectile dysfunction,  unspecified erectile dysfunction type - sildenafil (VIAGRA) 100 MG tablet; Take 0.5-1 tablets (50-100 mg total) by mouth daily as needed for erectile dysfunction.  Dispense: 10 tablet; Refill: 5  5. Need for influenza vaccination - Flu Vaccine QUAD 36+ mos IM  6. Need for pneumococcal vaccination - Pneumococcal polysaccharide vaccine 23-valent greater than or equal to 2yo subcutaneous/IM   Return in about 6 weeks (around 01/21/2016).    Fernande Bras, PA-C Physician Assistant-Certified [facility]

## 2015-12-10 NOTE — Patient Instructions (Addendum)
Please schedule with your eye specialist. When starting back on the metformin, take 1/2 tablet twice each day for the first week, then increase to the whole tablet. Please start a baby aspirin (81 mg) each day.    IF you received an x-ray today, you will receive an [facility] from [facility]. Please contact [facility] at [phone] with questions or concerns regarding your invoice.   IF you received labwork today, you will receive an invoice from [facility]. Please contact [facility] at [phone] with questions or concerns regarding your invoice.   Our billing staff will not be able to assist you with questions regarding bills from these companies.  You will be contacted with the lab results as soon as they are available. The fastest way to get your results is to activate your My Chart account. Instructions are located on the last page of this paperwork. If you have not heard from us regarding the results in 2 weeks, please contact this office.

## 2016-05-28 ENCOUNTER — Encounter: Payer: Self-pay | Admitting: Physician Assistant

## 2016-05-28 ENCOUNTER — Ambulatory Visit (INDEPENDENT_AMBULATORY_CARE_PROVIDER_SITE_OTHER): Payer: Medicare HMO | Admitting: Physician Assistant

## 2016-05-28 ENCOUNTER — Ambulatory Visit (INDEPENDENT_AMBULATORY_CARE_PROVIDER_SITE_OTHER): Payer: Medicare HMO

## 2016-05-28 VITALS — BP 127/92 | HR 83 | Temp 98.2°F | Resp 16 | Ht 69.0 in | Wt 173.0 lb

## 2016-05-28 DIAGNOSIS — I10 Essential (primary) hypertension: Secondary | ICD-10-CM | POA: Diagnosis not present

## 2016-05-28 DIAGNOSIS — E1165 Type 2 diabetes mellitus with hyperglycemia: Secondary | ICD-10-CM | POA: Diagnosis not present

## 2016-05-28 DIAGNOSIS — M25562 Pain in left knee: Secondary | ICD-10-CM

## 2016-05-28 DIAGNOSIS — Z1159 Encounter for screening for other viral diseases: Secondary | ICD-10-CM

## 2016-05-28 DIAGNOSIS — N529 Male erectile dysfunction, unspecified: Secondary | ICD-10-CM

## 2016-05-28 DIAGNOSIS — E7800 Pure hypercholesterolemia, unspecified: Secondary | ICD-10-CM | POA: Diagnosis not present

## 2016-05-28 DIAGNOSIS — IMO0001 Reserved for inherently not codable concepts without codable children: Secondary | ICD-10-CM

## 2016-05-28 DIAGNOSIS — Z114 Encounter for screening for human immunodeficiency virus [HIV]: Secondary | ICD-10-CM | POA: Diagnosis not present

## 2016-05-28 MED ORDER — CYCLOBENZAPRINE HCL 5 MG PO TABS: 5.0000 mg | ORAL_TABLET | Freq: Three times a day (TID) | ORAL | 0 refills | Status: DC | PRN

## 2016-05-28 MED ORDER — SILDENAFIL CITRATE 100 MG PO TABS: 50.0000 mg | ORAL_TABLET | Freq: Every day | ORAL | 5 refills | Status: DC | PRN

## 2016-05-28 NOTE — Patient Instructions (Signed)
     IF you received an x-ray today, you will receive an invoice from [facility]. Please contact [facility] at [phone] with questions or concerns regarding your invoice.   IF you received labwork today, you will receive an invoice from [facility]. Please contact [facility] at [phone] with questions or concerns regarding your invoice.   Our billing staff will not be able to assist you with questions regarding bills from these companies.  You will be contacted with the lab results as soon as they are available. The fastest way to get your results is to activate your My Chart account. Instructions are located on the last page of this paperwork. If you have not heard from us regarding the results in 2 weeks, please contact this office.     

## 2016-05-28 NOTE — Progress Notes (Signed)
Timothy Costa  MRN: [id number] DOB: June 22, 1960  Subjective:  Pt presents to clinic for recheck and medication refill.  Aching pain in his left leg - occurs all the time - he has switched his wallet to a different pocket without any relief - no parestheisa - no burning pain in the bottom of the foot - trouble sleeping due to not being able to get comfortable - takes tylenol 587m 1-2 times a day with minimal help  Glucose at home 300s - stop his Lantus due to financial reasons.  Review of Systems  Constitutional: Positive for chills. Negative for fever.  Musculoskeletal: Negative for back pain and gait problem.    Patient Active Problem List   Diagnosis Date Noted  . Erectile dysfunction 08/12/2015  . Type 2 diabetes mellitus without complication ([place] 005/69/7948 . Essential hypertension, benign 05/14/2015  . Hyperlipidemia 05/14/2015  . Uncontrolled diabetes mellitus type 2 without complications ([place] 101/65/5374   Current Outpatient Prescriptions on File Prior to Visit  Medication Sig Dispense Refill  . atorvastatin (LIPITOR) 10 MG tablet Take 1 tablet (10 mg total) by mouth at bedtime. 90 tablet 1  . glucose blood test strip Use to check home blood sugar twice daily 100 each 12  . losartan (COZAAR) 50 MG tablet Take 1 tablet (50 mg total) by mouth daily. 90 tablet 1   No current facility-administered medications on file prior to visit.     Allergies  Allergen Reactions  . Lisinopril Hives and Swelling  . Nsaids Swelling and Other (See Comments)    Gi upset    Pt patients past, family and social history were reviewed and updated.   Objective:  BP (!) 127/92   Pulse 83   Temp 98.2 F (36.8 C) (Oral)   Resp 16   Ht _0  (1.753 m)   Wt 173 lb (78.5 kg)   SpO2 98%   BMI 25.55 kg/m   Physical Exam  Constitutional: He is oriented to person, place, and time and well-developed, well-nourished, and in no distress.  HENT:  Head: Normocephalic and atraumatic.    Right Ear: External ear normal.  Left Ear: External ear normal.  Eyes: Conjunctivae are normal.  Neck: Normal range of motion.  Cardiovascular: Normal rate, regular rhythm, normal heart sounds and intact distal pulses.   Pulmonary/Chest: Effort normal and breath sounds normal. He has no wheezes.  Musculoskeletal:       Lumbar back: Normal.       Right lower leg: Normal. He exhibits no edema.       Left lower leg: He exhibits tenderness (mild TTP over the lateral calf area). He exhibits no edema.  Neurological: He is alert and oriented to person, place, and time. He has normal motor skills, normal strength and normal reflexes. He displays normal reflexes. A sensory deficit is present. Gait normal. Gait normal.  Skin: Skin is warm and dry.  Psychiatric: Mood, memory, affect and judgment normal.    Dg Lumbar Spine 2-3 Views  Result Date: 05/28/2016 CLINICAL DATA:  Radiating leg pain from the left fluid new region. EXAM: LUMBAR SPINE - 2-3 VIEW COMPARISON:  None. FINDINGS: Five non ribbed lumbar vertebrae with normal lumbar segmentation. Degenerative disc disease L5-S1 with small posterior marginal osteophytes and associated facet arthropathy. No acute fracture identified. No spondylolysis nor spondylolisthesis. Sacroiliac joints are symmetric in appearance. IMPRESSION: Degenerative disc disease L5-S1.  No acute osseous abnormality. Electronically Signed   By: DMeredith LeedsD.  On: 05/28/2016 18:40    Assessment and Plan :  Essential hypertension, benign - Plan: CMP14+EGFR  Uncontrolled diabetes mellitus type 2 without complications, unspecified long term insulin use status ([place]) - Plan: Hemoglobin A1c - check labs determine meds once labs return  Pure hypercholesterolemia - Plan: Lipid panel - check labs - determine medications once labs are back  Erectile dysfunction, unspecified erectile dysfunction type - Plan: sildenafil (VIAGRA) 100 MG tablet   Encounter for screening for HIV -  Plan: HIV antibody  Need for hepatitis C screening test - Plan: HCV Antibody RFX to Quant PCR  Arthralgia of left lower leg - Plan: cyclobenzaprine (FLEXERIL) 5 MG tablet, DG Lumbar Spine 2-3 Views - likely related to muscle spasms from arthritis in his back - he will continue the tylenol and add muscle relaxers at night - PT was encouraged but pt wants to wait - back stretches were discussed.  Windell Hummingbird PA-C  Primary [facility] at [facility] 05/29/2016 7:18 PM

## 2016-05-29 ENCOUNTER — Other Ambulatory Visit: Payer: Self-pay

## 2016-05-29 DIAGNOSIS — Z794 Long term (current) use of insulin: Principal | ICD-10-CM

## 2016-05-29 DIAGNOSIS — E119 Type 2 diabetes mellitus without complications: Secondary | ICD-10-CM

## 2016-05-29 MED ORDER — METFORMIN HCL 1000 MG PO TABS: 1000.0000 mg | ORAL_TABLET | Freq: Two times a day (BID) | ORAL | 1 refills | Status: DC

## 2016-05-29 NOTE — Telephone Encounter (Signed)
Fax req [facility] refill Metformin 1,000tabs #180, refil x 1 - req 90 day rx.    Last labs 05/28/2016

## 2016-06-02 LAB — CMP14+EGFR
A/G RATIO: 1.9 (ref 1.2–2.2)
ALBUMIN: 4.9 g/dL (ref 3.5–5.5)
ALK PHOS: 67 IU/L (ref 39–117)
ALT: 23 IU/L (ref 0–44)
AST: 16 IU/L (ref 0–40)
BUN / CREAT RATIO: 15 (ref 9–20)
BUN: 20 mg/dL (ref 6–24)
Bilirubin Total: 0.5 mg/dL (ref 0.0–1.2)
CO2: 14 mmol/L — AB (ref 18–29)
CREATININE: 1.36 mg/dL — AB (ref 0.76–1.27)
Calcium: 10.2 mg/dL (ref 8.7–10.2)
Chloride: 101 mmol/L (ref 96–106)
GFR calc Af Amer: 68 mL/min/{1.73_m2} (ref >59)
GFR calc non Af Amer: 59 mL/min/{1.73_m2} — ABNORMAL LOW (ref >59)
GLOBULIN, TOTAL: 2.6 g/dL (ref 1.5–4.5)
Glucose: 299 mg/dL — ABNORMAL HIGH (ref 65–99)
POTASSIUM: 4.9 mmol/L (ref 3.5–5.2)
SODIUM: 145 mmol/L — AB (ref 134–144)
Total Protein: 7.5 g/dL (ref 6.0–8.5)

## 2016-06-02 LAB — HEMOGLOBIN A1C
ESTIMATED AVERAGE GLUCOSE: 324 mg/dL
HEMOGLOBIN A1C: 12.9 % — AB (ref 4.8–5.6)

## 2016-06-02 LAB — HCV INTERPRETATION

## 2016-06-02 LAB — LIPID PANEL
CHOL/HDL RATIO: 5.4 ratio — AB (ref 0.0–5.0)
CHOLESTEROL TOTAL: 255 mg/dL — AB (ref 100–199)
HDL: 47 mg/dL (ref >39)
LDL CALC: 161 mg/dL — AB (ref 0–99)
TRIGLYCERIDES: 237 mg/dL — AB (ref 0–149)
VLDL Cholesterol Cal: 47 mg/dL — ABNORMAL HIGH (ref 5–40)

## 2016-06-02 LAB — HIV ANTIBODY (ROUTINE TESTING W REFLEX): HIV SCREEN 4TH GENERATION: NONREACTIVE

## 2016-06-02 LAB — HCV AB W REFLEX TO QUANT PCR: HCV Ab: <0.1 s/co ratio (ref 0.0–0.9)

## 2016-06-02 NOTE — Addendum Note (Signed)
Addended by: Morrell RiddleWEBER, Dalonda Simoni L on: 06/02/2016 01:05 PM   Modules accepted: Orders

## 2016-06-09 ENCOUNTER — Ambulatory Visit (INDEPENDENT_AMBULATORY_CARE_PROVIDER_SITE_OTHER): Payer: Medicare HMO | Admitting: Physician Assistant

## 2016-06-09 VITALS — BP 142/90 | HR 77 | Temp 98.2°F | Resp 16 | Ht 69.0 in | Wt 184.0 lb

## 2016-06-09 DIAGNOSIS — E1165 Type 2 diabetes mellitus with hyperglycemia: Secondary | ICD-10-CM

## 2016-06-09 DIAGNOSIS — E7800 Pure hypercholesterolemia, unspecified: Secondary | ICD-10-CM

## 2016-06-09 DIAGNOSIS — IMO0001 Reserved for inherently not codable concepts without codable children: Secondary | ICD-10-CM

## 2016-06-09 DIAGNOSIS — M5136 Other intervertebral disc degeneration, lumbar region: Secondary | ICD-10-CM | POA: Diagnosis not present

## 2016-06-09 DIAGNOSIS — M51379 Other intervertebral disc degeneration, lumbosacral region without mention of lumbar back pain or lower extremity pain: Secondary | ICD-10-CM | POA: Insufficient documentation

## 2016-06-09 DIAGNOSIS — M25562 Pain in left knee: Secondary | ICD-10-CM

## 2016-06-09 DIAGNOSIS — I10 Essential (primary) hypertension: Secondary | ICD-10-CM

## 2016-06-09 DIAGNOSIS — M5137 Other intervertebral disc degeneration, lumbosacral region: Secondary | ICD-10-CM | POA: Insufficient documentation

## 2016-06-09 MED ORDER — LOSARTAN POTASSIUM 50 MG PO TABS: 50.0000 mg | ORAL_TABLET | Freq: Every day | ORAL | 0 refills | Status: DC

## 2016-06-09 MED ORDER — ATORVASTATIN CALCIUM 40 MG PO TABS: 40.0000 mg | ORAL_TABLET | Freq: Every day | ORAL | 0 refills | Status: DC

## 2016-06-09 NOTE — Patient Instructions (Addendum)
  Please get the that paperwork with the medications that your insurance covers.  Got to physical therapy this will really help your back pain.   IF you received an x-ray today, you will receive an invoice from [facility]. Please contact [facility] at [phone] with questions or concerns regarding your invoice.   IF you received labwork today, you will receive an invoice from [facility]. Please contact [facility] at [phone] with questions or concerns regarding your invoice.   Our billing staff will not be able to assist you with questions regarding bills from these companies.  You will be contacted with the lab results as soon as they are available. The fastest way to get your results is to activate your My Chart account. Instructions are located on the last page of this paperwork. If you have not heard from us regarding the results in 2 weeks, please contact this office.

## 2016-06-09 NOTE — Progress Notes (Signed)
Timothy Costa  MRN: [id number] DOB: February 20, 1961  Subjective:  Pt presents to clinic for discussion of his lab results.    Continues leg achy at night - no sharp shooting pains -- ache that starts in his buttocks and then goes down the outside of his leg - tylenol only helps a little - he would like a handicapped placard because when it hurts really bad it hurts to walk.  Review of Systems  Constitutional: Negative for chills and fever.  Musculoskeletal: Negative for gait problem.    Patient Active Problem List   Diagnosis Date Noted  . Degenerative disc disease at L5-S1 level 06/09/2016  . Erectile dysfunction 08/12/2015  . Type 2 diabetes mellitus without complication (HCC) 05/14/2015  . Essential hypertension, benign 05/14/2015  . Hyperlipidemia 05/14/2015  . Uncontrolled diabetes mellitus type 2 without complications (HCC) 02/11/2015    Current Outpatient Prescriptions on File Prior to Visit  Medication Sig Dispense Refill  . cyclobenzaprine (FLEXERIL) 5 MG tablet Take 1 tablet (5 mg total) by mouth 3 (three) times daily as needed for muscle spasms. 30 tablet 0  . glucose blood test strip Use to check home blood sugar twice daily 100 each 12  . metFORMIN (GLUCOPHAGE) 1000 MG tablet Take 1 tablet (1,000 mg total) by mouth 2 (two) times daily with a meal. 180 tablet 1  . sildenafil (VIAGRA) 100 MG tablet Take 0.5-1 tablets (50-100 mg total) by mouth daily as needed for erectile dysfunction. 10 tablet 5   No current facility-administered medications on file prior to visit.     Allergies  Allergen Reactions  . Lisinopril Hives and Swelling  . Nsaids Swelling and Other (See Comments)    Gi upset    Pt patients past, family and social history were reviewed and updated.   Objective:  BP (!) 142/90 (BP Location: Right Arm, Patient Position: Sitting, Cuff Size: Small)   Pulse 77   Temp 98.2 F (36.8 C) (Oral)   Resp 16   Ht 5\' 9"  (1.753 m)   Wt 184 lb (83.5 kg)   SpO2  98%   BMI 27.17 kg/m   Physical Exam  Constitutional: He is oriented to person, place, and time and well-developed, well-nourished, and in no distress.  HENT:  Head: Normocephalic and atraumatic.  Right Ear: External ear normal.  Left Ear: External ear normal.  Eyes: Conjunctivae are normal.  Neck: Normal range of motion.  Pulmonary/Chest: Effort normal.  Neurological: He is alert and oriented to person, place, and time. Gait normal.  Skin: Skin is warm and dry.  Psychiatric: Mood, memory, affect and judgment normal.    Assessment and Plan :  Pure hypercholesterolemia - Plan: atorvastatin (LIPITOR) 40 MG tablet - continue current medications  Essential hypertension, benign - Plan: losartan (COZAAR) 50 MG tablet, Basic metabolic panel - controlled  Degenerative disc disease at L5-S1 level - Plan: Ambulatory referral to Physical Therapy - ? Additional piriformis syndrome  Arthralgia of left lower leg - Plan: Ambulatory referral to Physical Therapy - handicapped placard for 3 months given to patient - this was given to patient as he completes PT - he was encouraged to stay active  Uncontrolled diabetes mellitus type 2 without complications, unspecified long term insulin use status (HCC) - pt has a list of medications that his insurance will cover but he did not bring it with him - he will drop if off the form but he is most interested in trulicity type medications but he is  also concerned about medication costs.  Benny LennertSarah Eero Dini PA-C  [facility] at [facility] 06/09/2016 9:09 PM

## 2016-06-10 LAB — BASIC METABOLIC PANEL
BUN / CREAT RATIO: 10 (ref 9–20)
BUN: 10 mg/dL (ref 6–24)
CO2: 25 mmol/L (ref 18–29)
CREATININE: 1.01 mg/dL (ref 0.76–1.27)
Calcium: 9.4 mg/dL (ref 8.7–10.2)
Chloride: 100 mmol/L (ref 96–106)
GFR, EST AFRICAN AMERICAN: 97 mL/min/{1.73_m2} (ref >59)
GFR, EST NON AFRICAN AMERICAN: 84 mL/min/{1.73_m2} (ref >59)
Glucose: 139 mg/dL — ABNORMAL HIGH (ref 65–99)
Potassium: 4.4 mmol/L (ref 3.5–5.2)
Sodium: 139 mmol/L (ref 134–144)

## 2016-06-16 ENCOUNTER — Encounter: Payer: Self-pay | Admitting: Physical Therapy

## 2016-06-16 ENCOUNTER — Ambulatory Visit: Payer: Medicare HMO | Attending: Physician Assistant | Admitting: Physical Therapy

## 2016-06-16 DIAGNOSIS — G8929 Other chronic pain: Secondary | ICD-10-CM

## 2016-06-16 DIAGNOSIS — M79605 Pain in left leg: Secondary | ICD-10-CM

## 2016-06-16 DIAGNOSIS — R262 Difficulty in walking, not elsewhere classified: Secondary | ICD-10-CM | POA: Insufficient documentation

## 2016-06-16 DIAGNOSIS — M545 Low back pain: Secondary | ICD-10-CM | POA: Insufficient documentation

## 2016-06-16 NOTE — Therapy (Addendum)
[place], [place], [address] Phone: [phone]   Fax:  [phone]  Physical Therapy Evaluation/Discharge Summary  Patient Details  Name: Timothy Costa MRN: [id number] Date of Birth: [birth year] Referring Provider: Mancel Bale, PA-C  Encounter Date: 06/16/2016      PT End of Session - 06/16/16 1135    Visit Number 1   Number of Visits 13   Date for PT Re-Evaluation 07/31/16   Authorization Type [employer] MCR- KX at visit 15   PT Start Time 1136   PT Stop Time 1216   PT Time Calculation (min) 40 min   Activity Tolerance Patient tolerated treatment well   Behavior During Therapy [facility] for tasks assessed/performed      Past Medical History:  Diagnosis Date  . Diabetes mellitus   . Hypertension     History reviewed. No pertinent surgical history.  There were no vitals filed for this visit.       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 Remembers one day bending over and having difficulty getting back up. Back pain has been chronic on/off. Pain began going into L leg for about 1 month.    How long can you sit comfortably? unable   How long can you stand comfortably? 30 min   How long can you walk comfortably? unable comfortably, 1 year ago was walking 1 mile regularly   Patient Stated Goals decrease pain, sleep, walking   Currently in Pain? Yes   Pain Score 7    Pain Location Back   Pain Orientation Mid;Lower   Pain Descriptors / Indicators Aching   Pain Type Chronic pain   Aggravating Factors  sit, walk, stand, laying flat on back   Pain Relieving Factors try to find comfortable position   Multiple Pain Sites Yes   Pain Score 7   Pain Location Leg   Pain Orientation Lateral   Pain Descriptors / Indicators Aching   Pain Onset 1 to 4 weeks ago   Aggravating Factors  leg does not always hurt when back does            [facility]  PT Assessment - 06/16/16 0001      Assessment   Medical Diagnosis L4-5 degenerative disk disease, L leg pain   Referring Provider Mancel Bale, PA-C   Hand Dominance Right   Next MD Visit 3 months   Prior Therapy no     Precautions   Precautions None     Restrictions   Weight Bearing Restrictions No     Balance Screen   Has the patient fallen in the past 6 months No     [place] residence   Living Arrangements Alone   Additional Comments no stairs     Prior Function   Level of Independence Independent   Vocation On disability  does not plan to return     Cognition   Overall Cognitive Status Within Functional Limits for tasks assessed     Observation/Other Assessments   Focus on Therapeutic Outcomes (FOTO)  45% ability     Sensation   Additional Comments [facility]     Posture/Postural Control   Posture Comments flattening of lumbar lordosis, post pelvic tilt     ROM / Strength   AROM / PROM / Strength AROM;Strength     AROM   AROM Assessment Site Lumbar  Lumbar Flexion 40  pain into leg   Lumbar Extension 6  no leg pain   Lumbar - Right Side Bend --  discomfort in back   Lumbar - Left Side Bend --  pain into leg     Strength   Overall Strength Comments hamstrings 4-/5 bilat   Strength Assessment Site Hip   Right/Left Hip Right;Left   Right Hip Flexion 5/5  pain on L   Right Hip Extension 3/5   Right Hip ABduction 4/5   Left Hip Flexion 4+/5   Left Hip Extension 3/5   Left Hip ABduction 4-/5     Palpation   Spinal mobility repeated flexion increased lateral L leg pain, resolved with extension   Palpation comment TTP lumbar spine midline at L4/5/S1                   [facility] Adult PT Treatment/Exercise - 06/16/16 0001      Exercises   Exercises Knee/Hip     Knee/Hip Exercises: Stretches   Passive Hamstring Stretch Limitations supine with strap   Hip Flexor Stretch Limitations thomas test position    Piriformis Stretch Limitations push & pull     Knee/Hip Exercises: Prone   Hamstring Curl 10 reps   Hamstring Curl Limitations both                PT Education - 06/16/16 1243    Education provided Yes   Education Details anatomy of condition, POC, HEP, exercise form/rationale   Person(s) Educated Patient   Methods Explanation;Demonstration;Tactile cues;Verbal cues;Handout   Comprehension Verbalized understanding;Returned demonstration;Verbal cues required;Tactile cues required;Need further instruction          PT Short Term Goals - 06/16/16 1249      PT SHORT TERM GOAL #1   Title Pt will be independent & compliant with HEP as it has been established by 3/30   Baseline began establishing at eval, will progress as appropriate   Time 3   Period Weeks   Status New     PT SHORT TERM GOAL #2   Title Pt will be able to walk from car to PT gym with minimal discomfort in back   Baseline unable to walk comfortably for any amount of time at eval   Time 3   Period Weeks   Status New           PT Long Term Goals - 06/16/16 1251      PT LONG TERM GOAL #1   Title Average LBP <=3/10 with daily activities with resolution of radicular pain by 4/20   Baseline 7/10 with radicular pain at eval   Time 6   Period Weeks   Status New     PT LONG TERM GOAL #2   Title Pt will be able to find a comfortably position in bed to sleep at night to decrease sleep disturbance   Baseline wakes multiple times and cannot get comfortable   Time 6   Period Weeks   Status New     PT LONG TERM GOAL #3   Title FOTO to 58% ability to indicate signficant improvement in functional ability   Baseline 45% ability at eval   Time 6   Period Weeks   Status New     PT LONG TERM GOAL #4   Title Pt will be able to return to walking program for continued exercise following d/c    Baseline began educating on return at eval   Time  6   Period Weeks   Status New               Plan -  07/05/2016 1244    Clinical Impression Statement Pt presents to PT with complaints of LBP that has recently increased into L leg. Pt with straightening of lumbar lordosis and radicular pain increased with flexion, eased with extension. However, pt was unable to lay fully prone for hamstring curls-lack of flexibility and time in that position increased radicular pain, eased with one pillow under hips. tightness noted in hip flexors and weakness in posterior musculature. Pt will benefit from skilled PT in order to improve flexbility and strength in lumbopelvic and LE regions to improve support to lumbar spine.    Rehab Potential Good   PT Frequency 2x / week   PT Duration 6 weeks   PT Treatment/Interventions ADLs/Self Care Home Management;Cryotherapy;Electrical Stimulation;Iontophoresis 54m/ml Dexamethasone;Functional mobility training;Stair training;Gait training;Ultrasound;Traction;Moist Heat;Therapeutic activities;Therapeutic exercise;Balance training;Neuromuscular re-education;Patient/family education;Passive range of motion;Manual techniques;Dry needling;Taping   PT Next Visit Plan Lumbar spine extension mobility, nu step, abdominal engagement   PT Home Exercise Plan prone hamstring curl, piriformis stretch, hamstring stretch, thomas test stretch   Consulted and Agree with Plan of Care Patient      Patient will benefit from skilled therapeutic intervention in order to improve the following deficits and impairments:  Decreased range of motion, Difficulty walking, Increased muscle spasms, Pain, Decreased activity tolerance, Impaired flexibility, Improper body mechanics, Postural dysfunction, Decreased strength, Decreased mobility  Visit Diagnosis: Chronic midline low back pain, with sciatica presence unspecified - Plan: PT plan of care cert/re-cert  Pain in left leg - Plan: PT plan of care cert/re-cert  Difficulty in walking, not elsewhere classified - Plan: PT plan of care cert/re-cert       G-Codes - [id number]    Functional Assessment Tool Used (Outpatient Only) FOTO 45% ability (goal 58%), clinical judgement, ambulation ability   Functional Limitation Mobility: Walking and moving around   Mobility: Walking and Moving Around Current Status (([id number] At least 40 percent but less than 60 percent impaired, limited or restricted   Mobility: Walking and Moving Around Goal Status [phone] At least 20 percent but less than 40 percent impaired, limited or restricted       Problem List Patient Active Problem List   Diagnosis Date Noted  . Degenerative disc disease at L5-S1 level 06/09/2016  . Erectile dysfunction 08/12/2015  . Type 2 diabetes mellitus without complication ([place] 044/62/8638 . Essential hypertension, benign 05/14/2015  . Hyperlipidemia 05/14/2015  . Uncontrolled diabetes mellitus type 2 without complications ([place] 117/71/1657   Georganna Maxson C. Krystie Leiter PT, DPT [id number] PM   [facility] [place] [address]Phone: [phone]  Fax:  [phone] Name: Timothy GOLDENMRN: [id number] of Birth: [birth year] PHYSICAL THERAPY DISCHARGE SUMMARY  Visits from Start of Care: 1  Current functional level related to goals / functional outcomes: See above   Remaining deficits: See above   Education / Equipment: Anatomy of condition, POC, HEP, exercise form/rationale  Plan: Patient agrees to discharge.  Patient goals were not met. Patient is being discharged due to financial reasons.  ?????   Fredrik Mogel C. Tawona Filsinger PT, DPT 06/23/16 3:04 PM

## 2016-06-23 ENCOUNTER — Ambulatory Visit: Payer: Medicare HMO | Admitting: Physical Therapy

## 2016-06-24 ENCOUNTER — Other Ambulatory Visit: Payer: Self-pay | Admitting: Physician Assistant

## 2016-06-24 DIAGNOSIS — M25562 Pain in left knee: Secondary | ICD-10-CM

## 2016-06-24 NOTE — Telephone Encounter (Signed)
05/28/16 last refill and ov 

## 2016-06-26 ENCOUNTER — Ambulatory Visit: Payer: Medicare HMO | Admitting: Physical Therapy

## 2016-06-30 ENCOUNTER — Ambulatory Visit: Payer: Medicare HMO | Admitting: Physical Therapy

## 2016-07-03 ENCOUNTER — Encounter: Payer: Medicare PPO | Admitting: Physical Therapy

## 2016-07-07 ENCOUNTER — Ambulatory Visit: Payer: Medicare HMO | Admitting: Physical Therapy

## 2016-07-09 ENCOUNTER — Ambulatory Visit: Payer: Medicare HMO | Admitting: Physical Therapy

## 2016-07-14 ENCOUNTER — Encounter: Payer: Medicare PPO | Admitting: Physical Therapy

## 2016-07-17 ENCOUNTER — Encounter: Payer: Medicare PPO | Admitting: Physical Therapy

## 2016-07-21 ENCOUNTER — Encounter: Payer: Medicare PPO | Admitting: Physical Therapy

## 2016-07-24 ENCOUNTER — Encounter: Payer: Medicare PPO | Admitting: Physical Therapy

## 2016-07-28 ENCOUNTER — Encounter: Payer: Medicare PPO | Admitting: Physical Therapy

## 2016-07-31 ENCOUNTER — Encounter: Payer: Medicare PPO | Admitting: Physical Therapy

## 2016-09-08 ENCOUNTER — Other Ambulatory Visit: Payer: Self-pay | Admitting: Physician Assistant

## 2016-09-08 DIAGNOSIS — E7800 Pure hypercholesterolemia, unspecified: Secondary | ICD-10-CM

## 2016-09-17 ENCOUNTER — Other Ambulatory Visit: Payer: Self-pay | Admitting: Physician Assistant

## 2016-09-17 DIAGNOSIS — I10 Essential (primary) hypertension: Secondary | ICD-10-CM

## 2016-10-15 ENCOUNTER — Telehealth: Payer: Self-pay

## 2016-10-15 NOTE — Telephone Encounter (Signed)
Called pt to schedule Medicare Annual Wellness Visit. -nr  

## 2016-12-16 ENCOUNTER — Other Ambulatory Visit: Payer: Self-pay | Admitting: Physician Assistant

## 2016-12-16 DIAGNOSIS — Z794 Long term (current) use of insulin: Principal | ICD-10-CM

## 2016-12-16 DIAGNOSIS — E119 Type 2 diabetes mellitus without complications: Secondary | ICD-10-CM

## 2016-12-17 ENCOUNTER — Other Ambulatory Visit: Payer: Self-pay | Admitting: Physician Assistant

## 2016-12-17 DIAGNOSIS — I10 Essential (primary) hypertension: Secondary | ICD-10-CM

## 2017-02-11 ENCOUNTER — Other Ambulatory Visit: Payer: Self-pay | Admitting: Physician Assistant

## 2017-02-11 DIAGNOSIS — E119 Type 2 diabetes mellitus without complications: Secondary | ICD-10-CM

## 2017-02-11 DIAGNOSIS — Z794 Long term (current) use of insulin: Principal | ICD-10-CM

## 2017-02-20 ENCOUNTER — Other Ambulatory Visit: Payer: Self-pay | Admitting: Physician Assistant

## 2017-02-20 DIAGNOSIS — E7800 Pure hypercholesterolemia, unspecified: Secondary | ICD-10-CM

## 2017-02-20 DIAGNOSIS — E119 Type 2 diabetes mellitus without complications: Secondary | ICD-10-CM

## 2017-02-20 DIAGNOSIS — Z794 Long term (current) use of insulin: Secondary | ICD-10-CM

## 2017-05-03 ENCOUNTER — Other Ambulatory Visit: Payer: Self-pay

## 2017-05-03 ENCOUNTER — Ambulatory Visit (INDEPENDENT_AMBULATORY_CARE_PROVIDER_SITE_OTHER): Payer: Medicare HMO | Admitting: Physician Assistant

## 2017-05-03 ENCOUNTER — Encounter: Payer: Self-pay | Admitting: Physician Assistant

## 2017-05-03 VITALS — BP 124/82 | HR 64 | Temp 98.6°F | Resp 16 | Ht 69.0 in | Wt 161.8 lb

## 2017-05-03 DIAGNOSIS — I10 Essential (primary) hypertension: Secondary | ICD-10-CM

## 2017-05-03 DIAGNOSIS — E1165 Type 2 diabetes mellitus with hyperglycemia: Secondary | ICD-10-CM | POA: Diagnosis not present

## 2017-05-03 DIAGNOSIS — E7800 Pure hypercholesterolemia, unspecified: Secondary | ICD-10-CM

## 2017-05-03 LAB — POCT GLYCOSYLATED HEMOGLOBIN (HGB A1C): HEMOGLOBIN A1C: 14

## 2017-05-03 MED ORDER — GLIPIZIDE-METFORMIN HCL 2.5-500 MG PO TABS: 2.0000 | ORAL_TABLET | Freq: Two times a day (BID) | ORAL | 0 refills | Status: DC

## 2017-05-03 MED ORDER — LOSARTAN POTASSIUM 50 MG PO TABS: ORAL_TABLET | ORAL | 0 refills | Status: DC

## 2017-05-03 MED ORDER — ATORVASTATIN CALCIUM 40 MG PO TABS: 40.0000 mg | ORAL_TABLET | Freq: Every day | ORAL | 0 refills | Status: DC

## 2017-05-03 NOTE — Progress Notes (Signed)
05/03/2017 9:02 AM   DOB: 04/23/1960 / MRN: [id number]  SUBJECTIVE:  Timothy Costa is a 57 y.o. male presenting for medication refills for uncontrolled diabetes.  Patient last seen on 04/08/2017.  Later received a annual Medicare physical that included an A1c at 12.8.  He is prescribed metformin 1000 mg twice daily.  Urine microalbumin is out of date today.  He denies stocking glove paresthesia, chest pain, shortness of breath, sudden vision changes.  He is largely noncompliant with follow-ups.  He is apparently out of his losartan.  He is allergic to lisinopril and nsaids.   He  has a past medical history of Diabetes mellitus and Hypertension.    He  reports that he quit smoking about 3 years ago. His smoking use included cigars. He quit after 15.00 years of use. he has never used smokeless tobacco. He reports that he does not drink alcohol or use drugs. He  has no sexual activity history on file. The patient  has no past surgical history on file.  His family history includes Diabetes in his mother, sister, and son; Hypertension in his father; Stroke in his father.  Review of Systems  Constitutional: Negative for chills, diaphoresis and fever.  Respiratory: Negative for cough, hemoptysis, sputum production, shortness of breath and wheezing.   Cardiovascular: Negative for chest pain, orthopnea and leg swelling.  Gastrointestinal: Negative for nausea.  Skin: Negative for rash.  Neurological: Negative for dizziness.    The problem list and medications were reviewed and updated by myself where necessary and exist elsewhere in the encounter.   OBJECTIVE:  BP 124/82 (BP Location: Right Arm, Patient Position: Sitting, Cuff Size: Normal)   Pulse 64   Temp 98.6 F (37 C) (Oral)   Resp 16   Ht 5\' 9"  (1.753 m)   Wt 161 lb 12.8 oz (73.4 kg)   SpO2 98%   BMI 23.89 kg/m   Physical Exam  Constitutional: He is oriented to person, place, and time. He appears well-developed. He is active  and cooperative.  Non-toxic appearance.  Eyes: EOM are normal. Pupils are equal, round, and reactive to light.  Cardiovascular: Normal rate, regular rhythm, S1 normal, S2 normal, normal heart sounds, intact distal pulses and normal pulses. Exam reveals no gallop and no friction rub.  No murmur heard. Pulmonary/Chest: Effort normal. No stridor. No tachypnea. No respiratory distress. He has no wheezes. He has no rales.  Abdominal: He exhibits no distension.  Musculoskeletal: He exhibits no edema.  Neurological: He is alert and oriented to person, place, and time. He has normal strength and normal reflexes. He is not disoriented. No cranial nerve deficit or sensory deficit. He exhibits normal muscle tone. Coordination and gait normal.  Skin: Skin is warm and dry. He is not diaphoretic. No pallor.  Psychiatric: His behavior is normal.  Vitals reviewed.   Lab Results  Component Value Date   HGBA1C 14.0 05/03/2017   Lab Results  Component Value Date   MICROALBUR 0.6 02/11/2015      Results for orders placed or performed in visit on 05/03/17 (from the past 72 hour(s))  POCT glycosylated hemoglobin (Hb A1C)     Status: None   Collection Time: 05/03/17  9:00 AM  Result Value Ref Range   Hemoglobin A1C 14.0     No results found.  ASSESSMENT AND PLAN:  Timothy Costa was seen today for medication refill.  Diagnoses and all orders for this visit:  Uncontrolled type 2 diabetes mellitus with  hyperglycemia [facility]): Patient with exquisitely poor control of diabetes.  A1c too high to read.  He continues to consume sugar on a daily basis and has been through diabetes education.  He drinks [employer] and sweet teas daily.  It is unclear why he continues to do this despite having the knowledge that this is hurting him.  I have changed his diabetes medications today to include glipizide 10 mg daily throughout the day.  He says he will start this.  He has not been taking his losartan.  I had a long  discussion about the risk versus benefits of medicine with him today hopefully he will start taking his medicine and start coming to regular follow-ups with PA Weber.  Advised that if he does not get his A1c down with these measures along with lifestyle we will have no choice but to start him on insulin.  Essential hypertension, benign -     losartan (COZAAR) 50 MG tablet; Needs office visit  Pure hypercholesterolemia -     atorvastatin (LIPITOR) 40 MG tablet; Take 1 tablet (40 mg total) by mouth at bedtime.  Other orders -     POCT glycosylated hemoglobin (Hb A1C) -     Microalbumin, urine -     glipiZIDE-metformin (METAGLIP) 2.5-500 MG tablet; Take 2 tablets by mouth 2 (two) times daily before a meal.    The patient is advised to call or return to clinic if he does not see an improvement in symptoms, or to seek the care of the closest emergency department if he worsens with the above plan.   Deliah Boston, MHS, PA-C Primary Care at [facility] 05/03/2017 9:02 AM

## 2017-05-03 NOTE — Patient Instructions (Addendum)
  Please come back in 3 months and see Timothy Costa.  If it is very important that you keep your follow-up appointments in place.  Both [name] and I want you to be well, and it can be difficult to help you if we do not see you often.  Please work on the exercise component, and avoid any kind of sugars in your diet particularly [facility] and sweet tea.  Please take the medication as prescribed.  The losartan is protecting your kidneys from kidney damage that can be caused by diabetes.  It is worth the risk of side effects otherwise we would not prescribe it for you.  If he can get your sugars down in the next 3 months we will have to start you on insulin.   IF you received an x-ray today, you will [facility]. Please contact [facility] at [phone] with questions or concerns regarding your invoice.   IF you received labwork today, you will receive an invoice from [facility]. Please contact [facility] at [phone] with questions or concerns regarding your invoice.   Our billing staff will not be able to assist you with questions regarding bills from these companies.  You will be contacted with the lab results as soon as they are available. The fastest way to get your results is to activate your My Chart account. Instructions are located on the last page of this paperwork. If you have not heard from us regarding the results in 2 weeks, please contact this office.

## 2017-05-04 ENCOUNTER — Other Ambulatory Visit: Payer: Self-pay | Admitting: Physician Assistant

## 2017-05-04 DIAGNOSIS — I10 Essential (primary) hypertension: Secondary | ICD-10-CM

## 2017-05-04 LAB — MICROALBUMIN, URINE: Microalbumin, Urine: 54.4 ug/mL

## 2017-07-25 ENCOUNTER — Other Ambulatory Visit: Payer: Self-pay | Admitting: Physician Assistant

## 2017-07-25 DIAGNOSIS — E1165 Type 2 diabetes mellitus with hyperglycemia: Secondary | ICD-10-CM

## 2017-07-29 ENCOUNTER — Other Ambulatory Visit: Payer: Self-pay | Admitting: Physician Assistant

## 2017-07-29 DIAGNOSIS — E119 Type 2 diabetes mellitus without complications: Secondary | ICD-10-CM

## 2017-07-29 DIAGNOSIS — Z794 Long term (current) use of insulin: Principal | ICD-10-CM

## 2017-08-10 ENCOUNTER — Encounter: Payer: Self-pay | Admitting: Physician Assistant

## 2017-08-10 ENCOUNTER — Ambulatory Visit (INDEPENDENT_AMBULATORY_CARE_PROVIDER_SITE_OTHER): Payer: Medicare HMO | Admitting: Physician Assistant

## 2017-08-10 ENCOUNTER — Other Ambulatory Visit: Payer: Self-pay

## 2017-08-10 VITALS — BP 130/62 | HR 90 | Temp 98.0°F | Resp 18 | Ht 69.0 in | Wt 164.0 lb

## 2017-08-10 DIAGNOSIS — I10 Essential (primary) hypertension: Secondary | ICD-10-CM | POA: Diagnosis not present

## 2017-08-10 DIAGNOSIS — E7800 Pure hypercholesterolemia, unspecified: Secondary | ICD-10-CM

## 2017-08-10 DIAGNOSIS — E1165 Type 2 diabetes mellitus with hyperglycemia: Secondary | ICD-10-CM | POA: Diagnosis not present

## 2017-08-10 DIAGNOSIS — Z23 Encounter for immunization: Secondary | ICD-10-CM | POA: Diagnosis not present

## 2017-08-10 LAB — CBC WITH DIFFERENTIAL/PLATELET
BASOS ABS: 0 10*3/uL (ref 0.0–0.2)
Basos: 0 %
EOS (ABSOLUTE): 0.2 10*3/uL (ref 0.0–0.4)
Eos: 3 %
Hematocrit: 43.2 % (ref 37.5–51.0)
Hemoglobin: 14.4 g/dL (ref 13.0–17.7)
IMMATURE GRANS (ABS): 0 10*3/uL (ref 0.0–0.1)
Immature Granulocytes: 0 %
LYMPHS ABS: 2.1 10*3/uL (ref 0.7–3.1)
LYMPHS: 31 %
MCH: 28.8 pg (ref 26.6–33.0)
MCHC: 33.3 g/dL (ref 31.5–35.7)
MCV: 86 fL (ref 79–97)
Monocytes Absolute: 0.6 10*3/uL (ref 0.1–0.9)
Monocytes: 9 %
NEUTROS ABS: 3.8 10*3/uL (ref 1.4–7.0)
Neutrophils: 57 %
PLATELETS: 283 10*3/uL (ref 150–379)
RBC: 5 x10E6/uL (ref 4.14–5.80)
RDW: 13.6 % (ref 12.3–15.4)
WBC: 6.6 10*3/uL (ref 3.4–10.8)

## 2017-08-10 LAB — HEMOGLOBIN A1C
ESTIMATED AVERAGE GLUCOSE: 321 mg/dL
HEMOGLOBIN A1C: 12.8 % — AB (ref 4.8–5.6)

## 2017-08-10 LAB — CMP14+EGFR
A/G RATIO: 1.7 (ref 1.2–2.2)
ALK PHOS: 67 IU/L (ref 39–117)
ALT: 25 IU/L (ref 0–44)
AST: 20 IU/L (ref 0–40)
Albumin: 4.1 g/dL (ref 3.5–5.5)
BILIRUBIN TOTAL: 0.6 mg/dL (ref 0.0–1.2)
BUN/Creatinine Ratio: 11 (ref 9–20)
BUN: 12 mg/dL (ref 6–24)
CHLORIDE: 101 mmol/L (ref 96–106)
CO2: 23 mmol/L (ref 20–29)
Calcium: 9.4 mg/dL (ref 8.7–10.2)
Creatinine, Ser: 1.09 mg/dL (ref 0.76–1.27)
GFR calc non Af Amer: 76 mL/min/{1.73_m2} (ref >59)
GFR, EST AFRICAN AMERICAN: 88 mL/min/{1.73_m2} (ref >59)
GLUCOSE: 294 mg/dL — AB (ref 65–99)
Globulin, Total: 2.4 g/dL (ref 1.5–4.5)
POTASSIUM: 4.4 mmol/L (ref 3.5–5.2)
Sodium: 138 mmol/L (ref 134–144)
TOTAL PROTEIN: 6.5 g/dL (ref 6.0–8.5)

## 2017-08-10 LAB — LIPID PANEL
Chol/HDL Ratio: 2.6 ratio (ref 0.0–5.0)
Cholesterol, Total: 117 mg/dL (ref 100–199)
HDL: 45 mg/dL (ref >39)
LDL Calculated: 56 mg/dL (ref 0–99)
Triglycerides: 80 mg/dL (ref 0–149)
VLDL Cholesterol Cal: 16 mg/dL (ref 5–40)

## 2017-08-10 MED ORDER — OLMESARTAN MEDOXOMIL 20 MG PO TABS: 20.0000 mg | ORAL_TABLET | Freq: Every day | ORAL | 0 refills | Status: DC

## 2017-08-10 MED ORDER — METFORMIN HCL ER 500 MG PO TB24: 1000.0000 mg | ORAL_TABLET | Freq: Two times a day (BID) | ORAL | 0 refills | Status: DC

## 2017-08-10 NOTE — Progress Notes (Signed)
Timothy Costa  MRN: [id number] DOB: 06-Jun-1960  PCP: Mancel Bale, PA-C  Chief Complaint  Patient presents with  . Diabetes    follow up     Subjective:  Pt presents to clinic for recheck of his diabetes.  He has noticed that he has a bad taste in his mouth - he thinks he happened with the metformin.  He has not been taking his losartan - he is not sure when he stopped this medication  Glucose at home - morning 150s, 160s about an hour after he eats  Breakfast - [employer] - bacon egg cheese and coffee with splenda and cream Lunch - sandwich and sweet tea Dinner - fried chicken and veggies Eats a lot of sweets and sugary sodas  He is able to do what he wants to do when he wants to do it without chest pain or SOB.  History is obtained by patient.  Review of Systems  Constitutional: Negative for chills and fever.  Eyes: Negative for visual disturbance.  Respiratory: Negative for cough and shortness of breath.   Cardiovascular: Negative for chest pain, palpitations and leg swelling.  Neurological: Negative for dizziness, light-headedness, numbness and headaches.       No pain in his feet    Patient Active Problem List   Diagnosis Date Noted  . Degenerative disc disease at L5-S1 level 06/09/2016  . Erectile dysfunction 08/12/2015  . Essential hypertension, benign 05/14/2015  . Hyperlipidemia 05/14/2015  . Uncontrolled diabetes mellitus type 2 without complications ([place]) 56/25/6389    Current Outpatient Medications on File Prior to Visit  Medication Sig Dispense Refill  . atorvastatin (LIPITOR) 40 MG tablet Take 1 tablet (40 mg total) by mouth at bedtime. 90 tablet 0  . glucose blood test strip Use to check home blood sugar twice daily 100 each 12   No current facility-administered medications on file prior to visit.     Allergies  Allergen Reactions  . Lisinopril Hives and Swelling  . Nsaids Swelling and Other (See Comments)    Gi upset    Past Medical  History:  Diagnosis Date  . Diabetes mellitus   . Hypertension    Social History   Social History Narrative  . Not on file   Social History   Tobacco Use  . Smoking status: Former Smoker    Years: 15.00    Types: Cigars    Last attempt to quit: 10/10/2013    Years since quitting: 3.8  . Smokeless tobacco: Never Used  Substance Use Topics  . Alcohol use: No  . Drug use: No   family history includes Diabetes in his mother, sister, and son; Hypertension in his father; Stroke in his father.     Objective:  BP 130/62   Pulse 90   Temp 98 F (36.7 C) (Oral)   Resp 18   Ht 5' 9"  (1.753 m)   Wt 164 lb (74.4 kg)   SpO2 98%   BMI 24.22 kg/m  Body mass index is 24.22 kg/m.  Physical Exam  Constitutional: He is oriented to person, place, and time.  HENT:  Head: Normocephalic and atraumatic.  Right Ear: External ear normal.  Left Ear: External ear normal.  Eyes: Conjunctivae are normal.  Neck: Normal range of motion.  Cardiovascular: Normal rate, regular rhythm and normal heart sounds.  Pulmonary/Chest: Effort normal and breath sounds normal.  Neurological: He is alert and oriented to person, place, and time.  Skin: Skin is warm and  dry.  Diabetic Foot Exam - Simple   Simple Foot Form Visual Inspection No deformities, no ulcerations, no other skin breakdown bilaterally:  Yes Sensation Testing See comments:  Yes Pulse Check Posterior Tibialis and Dorsalis pulse intact bilaterally:  Yes Comments Has no feeling in right side heel and barely some feeling in left side  heel    Psychiatric: Judgment normal.    Assessment and Plan :  Uncontrolled type 2 diabetes mellitus with hyperglycemia (HCC) - Plan: olmesartan (BENICAR) 20 MG tablet, metFORMIN (GLUCOPHAGE XR) 500 MG 24 hr tablet, Ambulatory referral to diabetic education, CMP14+EGFR, CBC with Differential/Platelet, Hemoglobin A1c, HM DIABETES FOOT EXAM - pt has a poor diet for his DM - we discussed was to change his  diet - he is interested in DM classes again.  We discussed other medications options - he is interested in starting trulicty 1st - he will likely need another agent and maybe insulin.    Essential hypertension, benign - Plan: olmesartan (BENICAR) 20 MG tablet, CMP14+EGFR - restart ARB for kidney protection as well as to get a little better control of his BP -   Pure hypercholesterolemia - Plan: Lipid panel - check labs and then do medications based on those results  Need for diphtheria-tetanus-pertussis (Tdap) vaccine - Plan: Tdap vaccine greater than or equal to 7yo IM  Windell Hummingbird PA-C  Primary Care at [facility] 08/10/2017 9:23 AM

## 2017-08-10 NOTE — Patient Instructions (Addendum)
   IF you received an x-ray today, you will receive an invoice from [facility]. Please contact [facility] at [phone] with questions or concerns regarding your invoice.   IF you received labwork today, you will receive an invoice from [facility]. Please contact [facility] at [phone] with questions or concerns regarding your invoice.   Our billing staff will not be able to assist you with questions regarding bills from these companies.  You will be contacted with the lab results as soon as they are available. The fastest way to get your results is to activate your My Chart account. Instructions are located on the last page of this paperwork. If you have not heard from us regarding the results in 2 weeks, please contact this office.     Diabetes Mellitus and Nutrition When you have diabetes (diabetes mellitus), it is very important to have healthy eating habits because your blood sugar (glucose) levels are greatly affected by what you eat and drink. Eating healthy foods in the appropriate amounts, at about the same times every day, can help you:  Control your blood glucose.  Lower your risk of heart disease.  Improve your blood pressure.  Reach or maintain a healthy weight.  Every person with diabetes is different, and each person has different needs for a meal plan. Your health care provider may recommend that you work with a diet and nutrition specialist (dietitian) to make a meal plan that is best for you. Your meal plan may vary depending on factors such as:  The calories you need.  The medicines you take.  Your weight.  Your blood glucose, blood pressure, and cholesterol levels.  Your activity level.  Other health conditions you have, such as heart or kidney disease.  How do carbohydrates affect me? Carbohydrates affect your blood glucose level more than any other type of food. Eating carbohydrates naturally increases the amount of glucose in your  blood. Carbohydrate counting is a method for keeping track of how many carbohydrates you eat. Counting carbohydrates is important to keep your blood glucose at a healthy level, especially if you use insulin or take certain oral diabetes medicines. It is important to know how many carbohydrates you can safely have in each meal. This is different for every person. Your dietitian can help you calculate how many carbohydrates you should have at each meal and for snack. Foods that contain carbohydrates include:  Bread, cereal, rice, pasta, and crackers.  Potatoes and corn.  Peas, beans, and lentils.  Milk and yogurt.  Fruit and juice.  Desserts, such as cakes, cookies, ice cream, and candy.  How does alcohol affect me? Alcohol can cause a sudden decrease in blood glucose (hypoglycemia), especially if you use insulin or take certain oral diabetes medicines. Hypoglycemia can be a life-threatening condition. Symptoms of hypoglycemia (sleepiness, dizziness, and confusion) are similar to symptoms of having too much alcohol. If your health care provider says that alcohol is safe for you, follow these guidelines:  Limit alcohol intake to no more than 1 drink per day for nonpregnant women and 2 drinks per day for men. One drink equals 12 oz of beer, 5 oz of wine, or 1 oz of hard liquor.  Do not drink on an empty stomach.  Keep yourself hydrated with water, diet soda, or unsweetened iced tea.  Keep in mind that regular soda, juice, and other mixers may contain a lot of sugar and must be counted as carbohydrates.  What are tips for following   this plan? Reading food labels  Start by checking the serving size on the label. The amount of calories, carbohydrates, fats, and other nutrients listed on the label are based on one serving of the food. Many foods contain more than one serving per package.  Check the total grams (g) of carbohydrates in one serving. You can calculate the number of servings of  carbohydrates in one serving by dividing the total carbohydrates by 15. For example, if a food has 30 g of total carbohydrates, it would be equal to 2 servings of carbohydrates.  Check the number of grams (g) of saturated and trans fats in one serving. Choose foods that have low or no amount of these fats.  Check the number of milligrams (mg) of sodium in one serving. Most people should limit total sodium intake to less than 2,300 mg per day.  Always check the nutrition information of foods labeled as "low-fat" or "nonfat". These foods may be higher in added sugar or refined carbohydrates and should be avoided.  Talk to your dietitian to identify your daily goals for nutrients listed on the label. Shopping  Avoid buying canned, premade, or processed foods. These foods tend to be high in fat, sodium, and added sugar.  Shop around the outside edge of the grocery store. This includes fresh fruits and vegetables, bulk grains, fresh meats, and fresh dairy. Cooking  Use low-heat cooking methods, such as baking, instead of high-heat cooking methods like deep frying.  Cook using healthy oils, such as olive, canola, or sunflower oil.  Avoid cooking with butter, cream, or high-fat meats. Meal planning  Eat meals and snacks regularly, preferably at the same times every day. Avoid going long periods of time without eating.  Eat foods high in fiber, such as fresh fruits, vegetables, beans, and whole grains. Talk to your dietitian about how many servings of carbohydrates you can eat at each meal.  Eat 4-6 ounces of lean protein each day, such as lean meat, chicken, fish, eggs, or tofu. 1 ounce is equal to 1 ounce of meat, chicken, or fish, 1 egg, or 1/4 cup of tofu.  Eat some foods each day that contain healthy fats, such as avocado, nuts, seeds, and fish. Lifestyle   Check your blood glucose regularly.  Exercise at least 30 minutes 5 or more days each week, or as told by your health care  provider.  Take medicines as told by your health care provider.  Do not use any products that contain nicotine or tobacco, such as cigarettes and e-cigarettes. If you need help quitting, ask your health care provider.  Work with a counselor or diabetes educator to identify strategies to manage stress and any emotional and social challenges. What are some questions to ask my health care provider?  Do I need to meet with a diabetes educator?  Do I need to meet with a dietitian?  What number can I call if I have questions?  When are the best times to check my blood glucose? Where to find more information:  American Diabetes Association: diabetes.org/food-and-fitness/food  Academy of Nutrition and Dietetics: www.eatright.org/resources/health/diseases-and-conditions/diabetes  National Institute of Diabetes and Digestive and Kidney Diseases (NIH): www.niddk.nih.gov/health-information/diabetes/overview/diet-eating-physical-activity Summary  A healthy meal plan will help you control your blood glucose and maintain a healthy lifestyle.  Working with a diet and nutrition specialist (dietitian) can help you make a meal plan that is best for you.  Keep in mind that carbohydrates and alcohol have immediate effects on your blood glucose   levels. It is important to count carbohydrates and to use alcohol carefully. This information is not intended to replace advice given to you by your health care provider. Make sure you discuss any questions you have with your health care provider. Document Released: 12/25/2004 Document Revised: 05/04/2016 Document Reviewed: 05/04/2016 Elsevier Interactive Patient Education  2018 Elsevier Inc.  

## 2017-08-11 ENCOUNTER — Other Ambulatory Visit: Payer: Self-pay | Admitting: Physician Assistant

## 2017-08-11 DIAGNOSIS — E7800 Pure hypercholesterolemia, unspecified: Secondary | ICD-10-CM

## 2017-08-13 MED ORDER — DULAGLUTIDE 0.75 MG/0.5ML ~~LOC~~ SOAJ: 0.7500 mg | SUBCUTANEOUS | 0 refills | Status: DC

## 2017-08-13 NOTE — Addendum Note (Signed)
Addended by: Morrell Riddle on: 08/13/2017 05:11 PM   Modules accepted: Orders

## 2017-08-16 ENCOUNTER — Telehealth: Payer: Self-pay | Admitting: Physician Assistant

## 2017-08-16 NOTE — Telephone Encounter (Signed)
Copied from CRM [id number]. Topic: Quick Communication - See Telephone Encounter >> Aug 16, 2017  9:19 AM Arlyss Gandy, NT wrote: CRM for notification. See Telephone encounter for: 08/16/17. Pt calling to get lab results.

## 2017-08-16 NOTE — Telephone Encounter (Signed)
Pt given results per notes of Benny Lennert, PA on 08/13/17.Unable to document in result note due to result note not being routed to [facility]. Follow up appt scheduled for 8/6.

## 2017-08-25 ENCOUNTER — Telehealth: Payer: Self-pay

## 2017-08-25 NOTE — Telephone Encounter (Signed)
Copied from CRM [id number]. Topic: General - Call Back - No Documentation >> Aug 24, 2017  8:46 AM Windy Kalata, NT wrote: Reason for CRM: patient is calling and states he missed a call from the office on 08/23/17 and is unsure why. Patient also states that he does not know how to work his pen for insulin and neither does the pharmacy. Please contact patient.

## 2017-08-25 NOTE — Telephone Encounter (Signed)
LVM for patient advising to come in for nurse visit to go over use of pen.

## 2017-08-26 ENCOUNTER — Other Ambulatory Visit: Payer: Self-pay | Admitting: Physician Assistant

## 2017-08-26 DIAGNOSIS — Z794 Long term (current) use of insulin: Principal | ICD-10-CM

## 2017-08-26 DIAGNOSIS — E119 Type 2 diabetes mellitus without complications: Secondary | ICD-10-CM

## 2017-08-26 NOTE — Telephone Encounter (Signed)
Metformin refill Last OV: 08/10/17 Last Refill:08/10/17 #540 no RF PCP: Benny Lennert PA-C Hgb A1C 08/10/17 12.8

## 2017-08-27 ENCOUNTER — Encounter: Payer: Self-pay | Admitting: Registered"

## 2017-08-27 ENCOUNTER — Encounter: Payer: Medicare HMO | Attending: Physician Assistant | Admitting: Registered"

## 2017-08-27 DIAGNOSIS — Z713 Dietary counseling and surveillance: Secondary | ICD-10-CM | POA: Diagnosis not present

## 2017-08-27 DIAGNOSIS — E1165 Type 2 diabetes mellitus with hyperglycemia: Secondary | ICD-10-CM | POA: Diagnosis not present

## 2017-08-27 DIAGNOSIS — IMO0001 Reserved for inherently not codable concepts without codable children: Secondary | ICD-10-CM

## 2017-08-27 NOTE — Patient Instructions (Addendum)
   Look at your feet each day. Use the handout for good foot care.  Since you were not able to find a dentist using your [facility] list of dentists, your local health department may be able to help you figure out how to get dental care.  Monday start using your 1 time week diabetes medication (Trulicity).   You can call insurance company about the [employer] to not have to prick your fingers.  Start walking again, when you first wake up would be a good time to do this.  Consider checking your blood sugar in the morning before you eat.  For breakfast try the egg mcmuffin instead of the biscuit, the sausage burrito is also a better choice than the egg, bacon biscuit. You can also use the snack handout for ideas for a easy light breakfast when not hungry.  When you don't feel like eating, you can try Boost or Ensure glucose control variety.  Cut back on the sweetened beverages. Ways you might do this  Try unsweet tea with splenda or try diluting the sweet tea with the unsweet tea  When eating fruit be sure to also eat protein like nuts, peanut butter,   Drink the zero sodas instead of regular soda

## 2017-08-27 NOTE — Progress Notes (Signed)
Diabetes Self-Management Education  Visit Type: First/Initial  Appt. Start Time: 0830 Appt. End Time: 1000  08/30/2017  [name], identified by name and date of birth, is a 57 y.o. male with a diagnosis of Diabetes: Type 2.   ASSESSMENT Patient states he is not using the Trulicity because he doesn't know how. Pt reports his pharmacist showed him a video, but pt reports he still didn't understand. During appointment, Timothy Costa, RD CDE, came into the appointment to provide training using a demo kit and video; patient verbalized and demonstrated ability to use pen. Pt stated his meter was not working correctly and RD tested it and it appears to be working fine. Pt states he does not like pricking his finger and would really like to use the continuous glucose monitor instead. RD provided flyer for him to look into insurance coverage.  Pt states he got out of the habit of exercising. Pt reports he used to get up in the morning and walk. 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 Pt states he usually gets up at 7 am.   Pt stated a few times being "weak on the stomach" and not wanting to eat. If the patient returns for a follow-up visit, RD may want to evaluate changes in weight to make sure patient's appetite changes and GI upset is not causing weight loss.  Patient states he used to get dental care through the free dental clinic and was very happy with the care. Pt reports he no longer qualifies for this and his insurance carrier provided him a list of approved providers but when he contacted several offices pt states he was told they don't accept his insurance. RD advised that he check with health department for other services which he may qualify for.  Diabetes Self-Management Education - 08/27/17 0849      Visit Information   Visit Type  First/Initial      Initial Visit   Diabetes Type  Type 2    Are you currently following a meal plan?  No    Are  you taking your medications as prescribed?  No Metformin 2,000 mg/ glipizide/metformin 1.0/175, Trulicity (hasn't started yet)    Date Diagnosed  8 yrs ago      Psychosocial Assessment   Self-care barriers  Low literacy    How often do you need to have someone help you when you read instructions, pamphlets, or other written materials from your doctor or pharmacy?  5 - Always    What is the last grade level you completed in school?  9      Complications   Last HgB A1C per patient/outside source  12.8 %    How often do you check your blood sugar?  0 times/day (not testing) doesn't know how to use     Fasting Blood glucose range (mg/dL)  -- 250-450    Have you had a dilated eye exam in the past 12 months?  Yes    Have you had a dental exam in the past 12 months?  No    Are you checking your feet?  No      Dietary Intake   Breakfast  bacon, egg, cheese OR gravy biscuit, coffee with splenda, cream    Snack (morning)  nabs, coke    Lunch  fish sandwich, fries sometimes, sweet tea or sprite medium    Snack (afternoon)  none    Dinner  green beans, mashed potates, baked chicken (not a lot of appetite sometimes forces to eat to take medication)    Snack (evening)  none    Beverage(s)  coffee, soda, sweet tea,      Exercise   Exercise Type  ADL's yard work    How many days per week to you exercise?  0    How many minutes per day do you exercise?  0    Total minutes per week of exercise  0      Patient Education   Previous Diabetes Education  No    Nutrition management   Role of diet in the treatment of diabetes and the relationship between the three main macronutrients and blood glucose level    Physical activity and exercise   Role of exercise on diabetes management, blood pressure control and cardiac health.    Medications  Reviewed patients medication for diabetes, action, purpose, timing of dose and side effects.    Monitoring  Taught/evaluated SMBG meter.      Individualized Goals  (developed by patient)   Nutrition  General guidelines for healthy choices and portions discussed    Physical Activity  Exercise 3-5 times per week    Medications  take my medication as prescribed    Monitoring   test my blood glucose as discussed      Outcomes   Expected Outcomes  Demonstrated interest in learning. Expect positive outcomes    Future DMSE  PRN    Program Status  Not Completed     Individualized Plan for Diabetes Self-Management Training:   Learning Objective:  Patient will have a greater understanding of diabetes self-management. Patient education plan is to attend individual and/or group sessions per assessed needs and concerns.  Patient Instructions   Look at your feet each day. Use the handout for good foot care.  Since you were not able to find a dentist using your [facility] list of dentists, your local health department may be able to help you figure out how to get dental care.  Monday start using your 1 time week diabetes medication (Trulicity).   You can call insurance company about the [employer] to not have to prick your fingers.  Start walking again, when you first wake up would be a good time to do this.  Consider checking your blood sugar in the morning before you eat.  For breakfast try the egg mcmuffin instead of the biscuit, the sausage burrito is also a better choice than the egg, bacon biscuit. You can also use the snack handout for ideas for a easy light breakfast when not hungry.  When you don't feel like eating, you can try Boost or Ensure glucose control variety.  Cut back on the sweetened beverages. Ways you might do this  Try unsweet tea with splenda or try diluting the sweet tea with the unsweet tea  When eating fruit be sure to also eat protein like nuts, peanut butter,   Drink the zero sodas instead of regular soda  Expected Outcomes:  Demonstrated interest in learning. Expect positive outcomes  Education material provided: Foot  Care (novo nordisk), Freestyle [employer], Snack sheet, Boost coupon  If problems or questions, patient to contact team via:  Phone  Future DSME appointment: PRN

## 2017-10-01 ENCOUNTER — Telehealth: Payer: Self-pay | Admitting: Physician Assistant

## 2017-10-01 NOTE — Telephone Encounter (Signed)
Copied from CRM [phone]. Topic: General - Other >> Oct 01, 2017  6:56 AM Gerrianne ScalePayne, Angela L wrote: Reason for CRM: pt states that the new DM medicine Dulaglutide (TRULICITY) 0.75 MG/0.5ML SOPN isn't working the readings is between 300-400  pt had Lantus in his refrigerator  use it yesterday and took the sugar reading down to 125 this morning I advised him was it old or expired he said that he didn't know but it worked pt hadn't had this medicine for a while   PATIENT HAS SEEN WEBER AND Radio producerCLARK

## 2017-10-04 NOTE — Telephone Encounter (Signed)
Lets have the patient come back in to discuss.

## 2017-10-04 NOTE — Telephone Encounter (Signed)
Please advise 

## 2017-10-06 ENCOUNTER — Other Ambulatory Visit: Payer: Self-pay

## 2017-10-06 ENCOUNTER — Encounter: Payer: Self-pay | Admitting: Physician Assistant

## 2017-10-06 ENCOUNTER — Ambulatory Visit (INDEPENDENT_AMBULATORY_CARE_PROVIDER_SITE_OTHER): Payer: Medicare HMO | Admitting: Physician Assistant

## 2017-10-06 VITALS — BP 130/82 | HR 92 | Temp 98.3°F | Resp 18 | Ht 69.0 in | Wt 165.4 lb

## 2017-10-06 DIAGNOSIS — E7800 Pure hypercholesterolemia, unspecified: Secondary | ICD-10-CM

## 2017-10-06 DIAGNOSIS — E1165 Type 2 diabetes mellitus with hyperglycemia: Secondary | ICD-10-CM | POA: Diagnosis not present

## 2017-10-06 DIAGNOSIS — I10 Essential (primary) hypertension: Secondary | ICD-10-CM

## 2017-10-06 MED ORDER — FREESTYLE LIBRE READER DEVI: 1.0000 | Freq: Once | 0 refills | Status: AC

## 2017-10-06 MED ORDER — INSULIN GLARGINE 100 UNIT/ML SOLOSTAR PEN: 55.0000 [IU] | PEN_INJECTOR | Freq: Every day | SUBCUTANEOUS | 1 refills | Status: DC

## 2017-10-06 MED ORDER — FREESTYLE LIBRE 14 DAY SENSOR MISC: 1.0000 "application " | 3 refills | Status: AC

## 2017-10-06 MED ORDER — SEMAGLUTIDE(0.25 OR 0.5MG/DOS) 2 MG/1.5ML ~~LOC~~ SOPN: 0.2500 mg | PEN_INJECTOR | SUBCUTANEOUS | 0 refills | Status: DC

## 2017-10-06 MED ORDER — METFORMIN HCL ER 500 MG PO TB24: 1000.0000 mg | ORAL_TABLET | Freq: Two times a day (BID) | ORAL | 0 refills | Status: DC

## 2017-10-06 NOTE — Progress Notes (Signed)
Timothy KeysCharles W Costa  MRN: [id number] DOB: 10/01/1960  PCP: Timothy Costa, Timothy Hustead L, PA-C  Chief Complaint  Patient presents with  . Diabetes    follow up     Subjective:  Timothy PoserCharles Costa is a 57 year old male presenting for diabetes follow up. Tried a month on Trulicity but glucose kept rising. Eventually rose to 400-500's. He then used remaining lantus and numbers have been as follow: (morning readings preprandial).  He wants to continue on the lantus because that makes him feel more comfortable. 103, 115, 125, 170, 129, 160,152, 157, 166, 192, 119, 135, 172, 117, 127, 145, 125, 109, 162, 179, 169, 131.  Uses between 40-50 units of lantus daily.   Started walking for exercise but has fallen off consistency slightly over past week. Started reading food labels since DM nutrition counsleing. One incident of eating cake recently. Increasing water intake (and some coke zero).  Attended DM class and is interested in attending more in the future. Would like to have a continuous glucose monitor  Denies foot pain.    History is obtained by patient.  Review of Systems  Constitutional: Negative for chills and fever.  Eyes: Positive for visual disturbance (blurry when glucose high). Negative for pain.  Respiratory: Negative for chest tightness and shortness of breath.   Cardiovascular: Negative for chest pain and palpitations.  Endocrine: Negative for polyuria.  Genitourinary: Negative for difficulty urinating, dysuria, frequency and hematuria.  Skin: Negative for color change and rash.  Neurological: Negative for numbness.    Patient Active Problem List   Diagnosis Date Noted  . Degenerative disc disease at L5-S1 level 06/09/2016  . Erectile dysfunction 08/12/2015  . Essential hypertension, benign 05/14/2015  . Hyperlipidemia 05/14/2015  . Uncontrolled diabetes mellitus type 2 without complications (HCC) 02/11/2015    Current Outpatient Medications on File Prior to Visit  Medication Sig  Dispense Refill  . atorvastatin (LIPITOR) 40 MG tablet TAKE 1 TABLET BY MOUTH EVERY DAY AT BEDTIME 90 tablet 0  . glipiZIDE-metformin (METAGLIP) 2.5-500 MG tablet Take 1 tablet by mouth 2 (two) times daily before a meal.    . glucose blood test strip Use to check home blood sugar twice daily 100 each 12  . metFORMIN (GLUCOPHAGE XR) 500 MG 24 hr tablet Take 2 tablets (1,000 mg total) by mouth 2 (two) times daily. 540 tablet 0  . olmesartan (BENICAR) 20 MG tablet Take 1 tablet (20 mg total) by mouth daily. 90 tablet 0  . Dulaglutide (TRULICITY) 0.75 MG/0.5ML SOPN Inject 0.75 mg into the skin once a week. (Patient not taking: Reported on 10/06/2017) 12 pen 0   No current facility-administered medications on file prior to visit.     Allergies  Allergen Reactions  . Lisinopril Hives and Swelling  . Nsaids Swelling and Other (See Comments)    Gi upset    Past Medical History:  Diagnosis Date  . Diabetes mellitus   . Hypertension    Social History   Social History Narrative  . Not on file   Social History   Tobacco Use  . Smoking status: Former Smoker    Years: 15.00    Types: Cigars    Last attempt to quit: 10/10/2013    Years since quitting: 3.9  . Smokeless tobacco: Never Used  Substance Use Topics  . Alcohol use: No  . Drug use: No   family history includes Diabetes in his mother, sister, and son; Hypertension in his father; Stroke in his father.  Objective:  BP 130/82   Pulse 92   Temp 98.3 F (36.8 C) (Oral)   Resp 18   Ht 5\' 9"  (1.753 m)   Wt 165 lb 6.4 oz (75 kg)   SpO2 98%   BMI 24.43 kg/m  Body mass index is 24.43 kg/m.  Wt Readings from Last 3 Encounters:  10/06/17 165 lb 6.4 oz (75 kg)  08/10/17 164 lb (74.4 kg)  05/03/17 161 lb 12.8 oz (73.4 kg)    Physical Exam  Constitutional: He is oriented to person, place, and time. He appears well-developed and well-nourished. No distress.  HENT:  Head: Normocephalic and atraumatic.  Eyes: Pupils are  equal, round, and reactive to light. Conjunctivae are normal.  Neck: Normal range of motion. Neck supple. No thyromegaly present.  Cardiovascular: Normal rate, regular rhythm and normal heart sounds.  No murmur heard. Pulses:      Radial pulses are 2+ on the right side, and 2+ on the left side.       Dorsalis pedis pulses are 2+ on the right side, and 2+ on the left side.       Posterior tibial pulses are 2+ on the right side, and 2+ on the left side.  Pulmonary/Chest: Effort normal and breath sounds normal. No stridor. He has no wheezes. He has no rales.  Feet:  Right Foot:  Protective Sensation: 10 sites tested. 10 sites sensed.  Skin Integrity: Negative for ulcer.  Left Foot:  Protective Sensation: 10 sites tested. 10 sites sensed.  Skin Integrity: Positive for skin breakdown (patch of faint skin lesion under foot consistent with tinea). Negative for ulcer.  Lymphadenopathy:    He has no cervical adenopathy.  Neurological: He is alert and oriented to person, place, and time.  Skin: Skin is warm and dry. No pallor.  Psychiatric: He has a normal mood and affect. His behavior is normal. Judgment and thought content normal.    Assessment and Plan :  1. Uncontrolled type 2 diabetes mellitus with hyperglycemia (HCC) Discussed that GLP1 agonists normally take about 6 weeks to become efficacious. Patient stopped at about 4; however, his concern for increased glucose readings is warranted. Discussed necessity of continuing glucose until further notice. Will increase lantus to 55 units nightly. Trial of ozempic to attempt to continue downward trend of A1C (08/10/17 = 12.8, down from 14.0) and promote better outcome overall.   Patient would like freestyle libre. Counseled him to take glucose readings morning preprandial and postprandial, afternoon, and night and indicate poor eating choices - Continuous Blood Gluc Receiver (FREESTYLE LIBRE READER) DEVI; 1 Device by Does not apply route once for 1  dose.  Dispense: 1 Device; Refill: 0 - Continuous Blood Gluc Sensor (FREESTYLE LIBRE 14 DAY SENSOR) MISC; 1 application by Does not apply route every 14 (fourteen) days.  Dispense: 3 each; Refill: 3 - Semaglutide (OZEMPIC) 0.25 or 0.5 MG/DOSE SOPN; Inject 0.25 mg into the skin once a week.  Dispense: 12 pen; Refill: 0 - Insulin Glargine (LANTUS SOLOSTAR) 100 UNIT/ML Solostar Pen; Inject 55 Units into the skin daily at 10 pm.  Dispense: 20 pen; Refill: 1 - metFORMIN (GLUCOPHAGE XR) 500 MG 24 hr tablet; Take 2 tablets (1,000 mg total) by mouth 2 (two) times daily.  Dispense: 540 tablet; Refill: 0 - Hemoglobin A1c - we will check today but he understands that this will mainly look at his dietary changes due to medication changes and length of time from last test which was 2 months  ago  Patient verbalized to me that they understand the following: diagnosis, what is being done for them, what to expect and what should be done at home.  Their questions have been answered.  See after visit summary for patient specific instructions.  Benny Lennert PA-C  Primary Care at [facility] 10/06/2017 8:45 AM  Please note: Portions of this report may have been transcribed using dragon voice recognition software. Every effort was made to ensure accuracy; however, inadvertent computerized transcription errors may be present.

## 2017-10-06 NOTE — Patient Instructions (Addendum)
  Try a lamasil antifungal cream on the foot   IF you received an x-ray today, you will receive an invoice from [facility]. Please contact [facility] at [phone] with questions or concerns regarding your invoice.   IF you received labwork today, you will receive an invoice from [facility]. Please contact [facility] at [phone] with questions or concerns regarding your invoice.   Our billing staff will not be able to assist you with questions regarding bills from these companies.  You will be contacted with the lab results as soon as they are available. The fastest way to get your results is to activate your My Chart account. Instructions are located on the last page of this paperwork. If you have not heard from us regarding the results in 2 weeks, please contact this office.

## 2017-10-07 ENCOUNTER — Encounter: Payer: Self-pay | Admitting: Physician Assistant

## 2017-10-07 LAB — HEMOGLOBIN A1C
Est. average glucose Bld gHb Est-mCnc: 298 mg/dL
HEMOGLOBIN A1C: 12 % — AB (ref 4.8–5.6)

## 2017-10-12 ENCOUNTER — Encounter: Payer: Self-pay | Admitting: Radiology

## 2017-11-08 ENCOUNTER — Other Ambulatory Visit: Payer: Self-pay | Admitting: Physician Assistant

## 2017-11-08 DIAGNOSIS — I10 Essential (primary) hypertension: Secondary | ICD-10-CM

## 2017-11-08 DIAGNOSIS — E7800 Pure hypercholesterolemia, unspecified: Secondary | ICD-10-CM

## 2017-11-08 DIAGNOSIS — E1165 Type 2 diabetes mellitus with hyperglycemia: Secondary | ICD-10-CM

## 2017-11-16 ENCOUNTER — Ambulatory Visit: Payer: Self-pay | Admitting: Physician Assistant

## 2017-12-01 ENCOUNTER — Telehealth: Payer: Self-pay | Admitting: Physician Assistant

## 2017-12-01 NOTE — Telephone Encounter (Signed)
Left a voicemail in regards to his appt he has with Benny LennertSarah Weber on 01/07/2018. The provider is leaving our office and needs to be rescheduled/cancelled.

## 2017-12-14 ENCOUNTER — Ambulatory Visit (INDEPENDENT_AMBULATORY_CARE_PROVIDER_SITE_OTHER): Payer: Medicare HMO | Admitting: Physician Assistant

## 2017-12-14 ENCOUNTER — Other Ambulatory Visit: Payer: Self-pay

## 2017-12-14 ENCOUNTER — Encounter: Payer: Self-pay | Admitting: Physician Assistant

## 2017-12-14 VITALS — BP 106/62 | HR 76 | Temp 98.2°F | Resp 18 | Ht 69.0 in | Wt 169.4 lb

## 2017-12-14 DIAGNOSIS — Z23 Encounter for immunization: Secondary | ICD-10-CM

## 2017-12-14 DIAGNOSIS — E7800 Pure hypercholesterolemia, unspecified: Secondary | ICD-10-CM

## 2017-12-14 DIAGNOSIS — I10 Essential (primary) hypertension: Secondary | ICD-10-CM

## 2017-12-14 DIAGNOSIS — E1165 Type 2 diabetes mellitus with hyperglycemia: Secondary | ICD-10-CM | POA: Diagnosis not present

## 2017-12-14 MED ORDER — INSULIN GLARGINE 100 UNIT/ML SOLOSTAR PEN: 50.0000 [IU] | PEN_INJECTOR | Freq: Every day | SUBCUTANEOUS | 1 refills | Status: AC

## 2017-12-14 MED ORDER — SEMAGLUTIDE(0.25 OR 0.5MG/DOS) 2 MG/1.5ML ~~LOC~~ SOPN: 0.2500 mg | PEN_INJECTOR | SUBCUTANEOUS | 3 refills | Status: AC

## 2017-12-14 MED ORDER — ATORVASTATIN CALCIUM 40 MG PO TABS: 40.0000 mg | ORAL_TABLET | Freq: Every day | ORAL | 4 refills | Status: AC

## 2017-12-14 MED ORDER — METFORMIN HCL ER 500 MG PO TB24: 1000.0000 mg | ORAL_TABLET | Freq: Two times a day (BID) | ORAL | 4 refills | Status: AC

## 2017-12-14 MED ORDER — OLMESARTAN MEDOXOMIL 20 MG PO TABS: 20.0000 mg | ORAL_TABLET | Freq: Every day | ORAL | 4 refills | Status: AC

## 2017-12-14 NOTE — Progress Notes (Signed)
Timothy Costa  MRN: [id number] DOB: Feb 01, 1961  PCP: Mancel Bale, PA-C  Chief Complaint  Patient presents with  . Medication Refill    med check    Subjective:  Pt presents to clinic for DM recheck.  Ozempic and Lantus 50 U -- on ozempic (he really like this medication but he only took for about a month - it was $100 but he thinks that he wants to pay that because he liked the results)  - 90-120 -- fasting - off ozempic for 3 weeks - 160s  He is seeing no numbers over 200s unless he eats something really sweet  He has switched to water and not sweet tea.  Plans tp start waling now that it is getting cooler outside.  History is obtained by patient.  Review of Systems  Constitutional: Negative for chills and fever.  Eyes: Negative for visual disturbance.  Respiratory: Negative for cough and shortness of breath.   Cardiovascular: Negative for chest pain, palpitations and leg swelling.  Neurological: Negative for dizziness, light-headedness, numbness and headaches.    Patient Active Problem List   Diagnosis Date Noted  . Degenerative disc disease at L5-S1 level 06/09/2016  . Erectile dysfunction 08/12/2015  . Essential hypertension, benign 05/14/2015  . Hyperlipidemia 05/14/2015  . Uncontrolled diabetes mellitus type 2 without complications ([place]) 04/59/9774    Current Outpatient Medications on File Prior to Visit  Medication Sig Dispense Refill  . Continuous Blood Gluc Sensor (FREESTYLE LIBRE 14 DAY SENSOR) MISC 1 application by Does not apply route every 14 (fourteen) days. 3 each 3  . glucose blood test strip Use to check home blood sugar twice daily 100 each 12   No current facility-administered medications on file prior to visit.     Allergies  Allergen Reactions  . Lisinopril Hives and Swelling  . Nsaids Swelling and Other (See Comments)    Gi upset    Past Medical History:  Diagnosis Date  . Diabetes mellitus   . Hypertension    Social History    Social History Narrative  . Not on file   Social History   Tobacco Use  . Smoking status: Former Smoker    Years: 15.00    Types: Cigars    Last attempt to quit: 10/10/2013    Years since quitting: 4.1  . Smokeless tobacco: Never Used  Substance Use Topics  . Alcohol use: No  . Drug use: No   family history includes Diabetes in his mother, sister, and son; Hypertension in his father; Stroke in his father.     Objective:  BP 106/62   Pulse 76   Temp 98.2 F (36.8 C) (Oral)   Resp 18   Ht 5' 9"  (1.753 m)   Wt 169 lb 6.4 oz (76.8 kg)   SpO2 99%   BMI 25.02 kg/m  Body mass index is 25.02 kg/m.  Wt Readings from Last 3 Encounters:  12/14/17 169 lb 6.4 oz (76.8 kg)  10/06/17 165 lb 6.4 oz (75 kg)  08/10/17 164 lb (74.4 kg)    Physical Exam  Constitutional: He is oriented to person, place, and time. He appears well-developed and well-nourished.  HENT:  Head: Normocephalic and atraumatic.  Right Ear: External ear normal.  Left Ear: External ear normal.  Eyes: Conjunctivae are normal.  Neck: Normal range of motion.  Cardiovascular: Normal rate, regular rhythm and normal heart sounds.  Pulmonary/Chest: Effort normal and breath sounds normal.  Neurological: He is alert and oriented  to person, place, and time.  Skin: Skin is warm and dry.  Psychiatric: Judgment normal.  Vitals reviewed.   Assessment and Plan :  Flu vaccine need - Plan: Flu Vaccine QUAD 36+ mos IM  Pure hypercholesterolemia - Plan: atorvastatin (LIPITOR) 40 MG tablet  Uncontrolled type 2 diabetes mellitus with hyperglycemia (HCC) - Plan: CMP14+EGFR, Hemoglobin A1c, Insulin Glargine (LANTUS SOLOSTAR) 100 UNIT/ML Solostar Pen, metFORMIN (GLUCOPHAGE XR) 500 MG 24 hr tablet, olmesartan (BENICAR) 20 MG tablet, Semaglutide (OZEMPIC) 0.25 or 0.5 MG/DOSE SOPN  Essential hypertension, benign - Plan: olmesartan (BENICAR) 20 MG tablet - well controlled  He has not been consistent with his medication - took  ozempic for 1 month - and he takes lantus as needed - overall his glucose readings have been lower - we talked about the importance of consistent medication usage so I can adjust and control his DM more effectively.  If his am lucose is running below 100 he will decrease his lantus by 5 U for several days.  Recheck in 3 months  Patient verbalized to me that they understand the following: diagnosis, what is being done for them, what to expect and what should be done at home.  Their questions have been answered.  See after visit summary for patient specific instructions.  Windell Hummingbird PA-C  Primary Care at [facility] 12/14/2017 8:28 AM  Please note: Portions of this report may have been transcribed using dragon voice recognition software. Every effort was made to ensure accuracy; however, inadvertent computerized transcription errors may be present.

## 2017-12-14 NOTE — Patient Instructions (Addendum)
   [facility] - [address], [place], [place] [address] Phone: [phone]   If you have lab work done today you will be contacted with your lab results within the next 2 weeks.  If you have not heard from us then please contact us. The fastest way to get your results is to register for My Chart.   IF you received an x-ray today, you will receive an invoice from [facility]. Please contact [facility] at [phone] with questions or concerns regarding your invoice.   IF you received labwork today, you will receive an invoice from [facility]. Please contact [facility] at [phone] with questions or concerns regarding your invoice.   Our billing staff will not be able to assist you with questions regarding bills from these companies.  You will be contacted with the lab results as soon as they are available. The fastest way to get your results is to activate your My Chart account. Instructions are located on the last page of this paperwork. If you have not heard from us regarding the results in 2 weeks, please contact this office.     

## 2017-12-15 LAB — CMP14+EGFR
ALBUMIN: 4.2 g/dL (ref 3.5–5.5)
ALT: 22 IU/L (ref 0–44)
AST: 16 IU/L (ref 0–40)
Albumin/Globulin Ratio: 1.7 (ref 1.2–2.2)
Alkaline Phosphatase: 56 IU/L (ref 39–117)
BUN / CREAT RATIO: 17 (ref 9–20)
BUN: 18 mg/dL (ref 6–24)
Bilirubin Total: 0.9 mg/dL (ref 0.0–1.2)
CALCIUM: 9.3 mg/dL (ref 8.7–10.2)
CO2: 24 mmol/L (ref 20–29)
CREATININE: 1.06 mg/dL (ref 0.76–1.27)
Chloride: 102 mmol/L (ref 96–106)
GFR, EST AFRICAN AMERICAN: 90 mL/min/{1.73_m2} (ref >59)
GFR, EST NON AFRICAN AMERICAN: 78 mL/min/{1.73_m2} (ref >59)
GLOBULIN, TOTAL: 2.5 g/dL (ref 1.5–4.5)
Glucose: 134 mg/dL — ABNORMAL HIGH (ref 65–99)
Potassium: 4.1 mmol/L (ref 3.5–5.2)
SODIUM: 139 mmol/L (ref 134–144)
TOTAL PROTEIN: 6.7 g/dL (ref 6.0–8.5)

## 2017-12-15 LAB — HEMOGLOBIN A1C
ESTIMATED AVERAGE GLUCOSE: 160 mg/dL
Hgb A1c MFr Bld: 7.2 % — ABNORMAL HIGH (ref 4.8–5.6)

## 2018-01-07 ENCOUNTER — Ambulatory Visit: Payer: Medicare HMO | Admitting: Physician Assistant

## 2018-06-03 ENCOUNTER — Emergency Department (HOSPITAL_COMMUNITY)
Admission: EM | Admit: 2018-06-03 | Discharge: 2018-06-03 | Disposition: A | Discharge status: Home / Self Care | Payer: Medicare HMO | Attending: Emergency Medicine | Admitting: Emergency Medicine

## 2018-06-03 ENCOUNTER — Encounter (HOSPITAL_COMMUNITY): Payer: Self-pay | Admitting: Emergency Medicine

## 2018-06-03 ENCOUNTER — Emergency Department (HOSPITAL_COMMUNITY): Payer: Medicare HMO

## 2018-06-03 ENCOUNTER — Other Ambulatory Visit: Payer: Self-pay

## 2018-06-03 DIAGNOSIS — Y92410 Unspecified street and highway as the place of occurrence of the external cause: Secondary | ICD-10-CM | POA: Diagnosis not present

## 2018-06-03 DIAGNOSIS — I10 Essential (primary) hypertension: Secondary | ICD-10-CM | POA: Diagnosis not present

## 2018-06-03 DIAGNOSIS — Z87891 Personal history of nicotine dependence: Secondary | ICD-10-CM | POA: Insufficient documentation

## 2018-06-03 DIAGNOSIS — E119 Type 2 diabetes mellitus without complications: Secondary | ICD-10-CM | POA: Insufficient documentation

## 2018-06-03 DIAGNOSIS — S8992XA Unspecified injury of left lower leg, initial encounter: Secondary | ICD-10-CM | POA: Insufficient documentation

## 2018-06-03 DIAGNOSIS — S299XXA Unspecified injury of thorax, initial encounter: Secondary | ICD-10-CM | POA: Diagnosis present

## 2018-06-03 DIAGNOSIS — Y998 Other external cause status: Secondary | ICD-10-CM | POA: Diagnosis not present

## 2018-06-03 DIAGNOSIS — S8991XA Unspecified injury of right lower leg, initial encounter: Secondary | ICD-10-CM | POA: Diagnosis not present

## 2018-06-03 DIAGNOSIS — Z79899 Other long term (current) drug therapy: Secondary | ICD-10-CM | POA: Diagnosis not present

## 2018-06-03 DIAGNOSIS — Y93I9 Activity, other involving external motion: Secondary | ICD-10-CM | POA: Diagnosis not present

## 2018-06-03 DIAGNOSIS — M7918 Myalgia, other site: Secondary | ICD-10-CM

## 2018-06-03 MED ORDER — TRAMADOL HCL 50 MG PO TABS: 50.0000 mg | ORAL_TABLET | Freq: Four times a day (QID) | ORAL | 0 refills | Status: DC | PRN

## 2018-06-03 MED ORDER — ACETAMINOPHEN 500 MG PO TABS
1000.0000 mg | ORAL_TABLET | Freq: Once | ORAL | Status: AC
  Administered 2018-06-03: 1000 mg via ORAL
  Filled 2018-06-03: qty 2

## 2018-06-03 MED ORDER — CYCLOBENZAPRINE HCL 5 MG PO TABS: 5.0000 mg | ORAL_TABLET | Freq: Three times a day (TID) | ORAL | 0 refills | Status: AC | PRN
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
## 2018-06-10 ENCOUNTER — Emergency Department (HOSPITAL_COMMUNITY)
Admission: EM | Admit: 2018-06-10 | Discharge: 2018-06-10 | Disposition: A | Discharge status: Home / Self Care | Payer: Medicare HMO | Attending: Emergency Medicine | Admitting: Emergency Medicine

## 2018-06-10 ENCOUNTER — Encounter (HOSPITAL_COMMUNITY): Payer: Self-pay | Admitting: Emergency Medicine

## 2018-06-10 ENCOUNTER — Emergency Department (HOSPITAL_COMMUNITY): Payer: Medicare HMO

## 2018-06-10 ENCOUNTER — Other Ambulatory Visit: Payer: Self-pay

## 2018-06-10 DIAGNOSIS — Z79899 Other long term (current) drug therapy: Secondary | ICD-10-CM | POA: Insufficient documentation

## 2018-06-10 DIAGNOSIS — E119 Type 2 diabetes mellitus without complications: Secondary | ICD-10-CM | POA: Insufficient documentation

## 2018-06-10 DIAGNOSIS — Z87891 Personal history of nicotine dependence: Secondary | ICD-10-CM | POA: Insufficient documentation

## 2018-06-10 DIAGNOSIS — R0789 Other chest pain: Secondary | ICD-10-CM | POA: Diagnosis not present

## 2018-06-10 DIAGNOSIS — I10 Essential (primary) hypertension: Secondary | ICD-10-CM | POA: Diagnosis not present

## 2018-06-10 DIAGNOSIS — Z794 Long term (current) use of insulin: Secondary | ICD-10-CM | POA: Diagnosis not present

## 2018-06-10 DIAGNOSIS — R079 Chest pain, unspecified: Secondary | ICD-10-CM | POA: Diagnosis present

## 2018-06-10 MED ORDER — HYDROCODONE-ACETAMINOPHEN 5-325 MG PO TABS: 1.0000 | ORAL_TABLET | ORAL | 0 refills | Status: AC | PRN

## 2018-06-10 NOTE — Discharge Instructions (Addendum)
Your x-rays are negative for rib fractures today.  I suspect you may have some deep bruising which may be the source of your pain.  You may take the pain medication prescribed if needed, especially at night since this is when you have the majority of your pain.  This medication will make you drowsy, do not drive within 4 hours of taking hydrocodone.  You may try a heating pad applied to the site for 20 minutes several times daily.
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
